# Patient Record
Sex: Female | Born: 1965 | Race: White | Hispanic: No | Marital: Married | Smoking: Never smoker
Health system: Southern US, Community
[De-identification: ages and names within clinical notes are randomized; demographics above are authoritative.]

## PROBLEM LIST (undated history)

## (undated) DIAGNOSIS — M549 Dorsalgia, unspecified: Secondary | ICD-10-CM

## (undated) DIAGNOSIS — IMO0002 Reserved for concepts with insufficient information to code with codable children: Secondary | ICD-10-CM

## (undated) DIAGNOSIS — E7800 Pure hypercholesterolemia, unspecified: Secondary | ICD-10-CM

## (undated) DIAGNOSIS — E559 Vitamin D deficiency, unspecified: Secondary | ICD-10-CM

## (undated) DIAGNOSIS — E039 Hypothyroidism, unspecified: Secondary | ICD-10-CM

## (undated) DIAGNOSIS — K829 Disease of gallbladder, unspecified: Secondary | ICD-10-CM

## (undated) HISTORY — DX: Pure hypercholesterolemia, unspecified: E78.00

## (undated) HISTORY — DX: Reserved for concepts with insufficient information to code with codable children: IMO0002

## (undated) HISTORY — DX: Vitamin D deficiency, unspecified: E55.9

## (undated) HISTORY — DX: Hypothyroidism, unspecified: E03.9

## (undated) HISTORY — PX: TONSILLECTOMY: SUR1361

## (undated) HISTORY — DX: Dorsalgia, unspecified: M54.9

## (undated) HISTORY — DX: Disease of gallbladder, unspecified: K82.9

---

## 1998-11-29 ENCOUNTER — Other Ambulatory Visit: Admission: RE | Admit: 1998-11-29 | Discharge: 1998-11-29 | Payer: Self-pay | Admitting: Gynecology

## 2001-01-25 ENCOUNTER — Other Ambulatory Visit: Admission: RE | Admit: 2001-01-25 | Discharge: 2001-01-25 | Payer: Self-pay | Admitting: Gynecology

## 2001-09-25 ENCOUNTER — Encounter: Admission: RE | Admit: 2001-09-25 | Discharge: 2001-12-24 | Payer: Self-pay | Admitting: Gynecology

## 2002-06-11 ENCOUNTER — Other Ambulatory Visit: Admission: RE | Admit: 2002-06-11 | Discharge: 2002-06-11 | Payer: Self-pay | Admitting: Gynecology

## 2003-02-18 ENCOUNTER — Ambulatory Visit (HOSPITAL_COMMUNITY): Admission: RE | Admit: 2003-02-18 | Discharge: 2003-02-18 | Payer: Self-pay | Admitting: Gynecology

## 2003-02-18 ENCOUNTER — Encounter (INDEPENDENT_AMBULATORY_CARE_PROVIDER_SITE_OTHER): Payer: Self-pay | Admitting: *Deleted

## 2003-02-26 HISTORY — PX: VAGINAL HYSTERECTOMY: SUR661

## 2003-03-10 ENCOUNTER — Observation Stay (HOSPITAL_COMMUNITY): Admission: RE | Admit: 2003-03-10 | Discharge: 2003-03-11 | Payer: Self-pay | Admitting: Gynecology

## 2003-03-11 ENCOUNTER — Encounter (INDEPENDENT_AMBULATORY_CARE_PROVIDER_SITE_OTHER): Payer: Self-pay | Admitting: Specialist

## 2004-05-11 ENCOUNTER — Other Ambulatory Visit: Admission: RE | Admit: 2004-05-11 | Discharge: 2004-05-11 | Payer: Self-pay | Admitting: Gynecology

## 2004-05-23 ENCOUNTER — Encounter: Admission: RE | Admit: 2004-05-23 | Discharge: 2004-05-23 | Payer: Self-pay | Admitting: Gynecology

## 2005-05-16 ENCOUNTER — Other Ambulatory Visit: Admission: RE | Admit: 2005-05-16 | Discharge: 2005-05-16 | Payer: Self-pay | Admitting: Gynecology

## 2005-06-07 ENCOUNTER — Encounter: Admission: RE | Admit: 2005-06-07 | Discharge: 2005-06-07 | Payer: Self-pay | Admitting: Gynecology

## 2006-07-09 ENCOUNTER — Other Ambulatory Visit: Admission: RE | Admit: 2006-07-09 | Discharge: 2006-07-09 | Payer: Self-pay | Admitting: Gynecology

## 2006-09-03 ENCOUNTER — Encounter: Admission: RE | Admit: 2006-09-03 | Discharge: 2006-09-03 | Payer: Self-pay | Admitting: Obstetrics and Gynecology

## 2007-07-12 ENCOUNTER — Other Ambulatory Visit: Admission: RE | Admit: 2007-07-12 | Discharge: 2007-07-12 | Payer: Self-pay | Admitting: Gynecology

## 2007-09-06 ENCOUNTER — Encounter: Admission: RE | Admit: 2007-09-06 | Discharge: 2007-09-06 | Payer: Self-pay | Admitting: Obstetrics and Gynecology

## 2008-07-15 ENCOUNTER — Encounter: Payer: Self-pay | Admitting: Women's Health

## 2008-07-15 ENCOUNTER — Other Ambulatory Visit: Admission: RE | Admit: 2008-07-15 | Discharge: 2008-07-15 | Payer: Self-pay | Admitting: Family Medicine

## 2008-07-15 ENCOUNTER — Ambulatory Visit: Payer: Self-pay | Admitting: Women's Health

## 2008-09-08 ENCOUNTER — Encounter: Admission: RE | Admit: 2008-09-08 | Discharge: 2008-09-08 | Payer: Self-pay | Admitting: Obstetrics and Gynecology

## 2009-03-10 ENCOUNTER — Ambulatory Visit: Payer: Self-pay | Admitting: Women's Health

## 2009-07-16 ENCOUNTER — Other Ambulatory Visit: Admission: RE | Admit: 2009-07-16 | Discharge: 2009-07-16 | Payer: Self-pay | Admitting: Gynecology

## 2009-07-16 ENCOUNTER — Ambulatory Visit: Payer: Self-pay | Admitting: Women's Health

## 2009-12-03 ENCOUNTER — Ambulatory Visit: Payer: Self-pay | Admitting: Women's Health

## 2009-12-10 ENCOUNTER — Encounter: Admission: RE | Admit: 2009-12-10 | Discharge: 2009-12-10 | Payer: Self-pay | Admitting: Gynecology

## 2010-05-28 DIAGNOSIS — IMO0002 Reserved for concepts with insufficient information to code with codable children: Secondary | ICD-10-CM

## 2010-05-28 HISTORY — DX: Reserved for concepts with insufficient information to code with codable children: IMO0002

## 2010-08-01 ENCOUNTER — Other Ambulatory Visit
Admission: RE | Admit: 2010-08-01 | Discharge: 2010-08-01 | Payer: Self-pay | Source: Home / Self Care | Admitting: Gynecology

## 2010-08-01 ENCOUNTER — Ambulatory Visit: Payer: Self-pay | Admitting: Women's Health

## 2011-01-13 NOTE — Op Note (Signed)
NAME:  Megan Potts, Megan Potts                        ACCOUNT NO.:  [id number]   MEDICAL RECORD NO.:  [id number]                   PATIENT TYPE:  AMB   LOCATION:  SDC                                  FACILITY:  [facility]   PHYSICIAN:  Ivor Costa. Farrel Gobble, M.D.              DATE OF BIRTH:  Sep 22, 1965   DATE OF PROCEDURE:  03/10/2003  DATE OF DISCHARGE:                                 OPERATIVE REPORT   PREOPERATIVE DIAGNOSIS:  Complex hyperplasia with atypia, cannot rule out  well-differentiated adenocarcinoma of the endometrium.   POSTOPERATIVE DIAGNOSIS:  Complex hyperplasia with atypia, cannot rule out  well-differentiated adenocarcinoma of the endometrium.   PROCEDURE:  Total vaginal hysterectomy.   SURGEON:  Ivor Costa. Farrel Gobble, M.D.   ASSISTANTMarcial Pacas P. Fontaine, M.D.   ANESTHESIA:  General.   FLUIDS REPLACED:  1800 mL lactated Ringer's.   URINE OUTPUT:  Not available.   ESTIMATED BLOOD LOSS:  Less than 100.   FINDINGS:  Unremarkable uterine serosa and cervix.   PATHOLOGY:  Uterus and cervix sent fresh to pathology.   PROCEDURE:  The patient was taken to the operating room, general anesthesia  was induced, and placed in the dorsal lithotomy position, prepped and draped  in the usual sterile fashion.  A sterile weighted speculum was placed in the  vagina, this was a Training and development officer.  The cervix was visualized, grasped with a single-  tooth tenaculum, and the vaginal mucosa was then injected with a total of 15  mL of a dilute Pitressin solution of 10 units in 50 mL of normal saline,  after which the vaginal mucosa was scored circumferentially around the  cervix.  The vaginal mucosa was then bluntly dissected up.  The cervix was  deviated superiorly.  The vaginal mucosa in the fornix was grasped  posteriorly and a posterior colpotomy was then performed.  It was extended  and the long sterile weighted Bonnano speculum was placed in the vagina.  The vaginal mucosa was dissected further.  The  uterosacral ligaments were  then clamped, transected, and suture ligated with 0 Vicryl and held.  The  anterior mucosa was then further dissected off sharply.  The peritoneum of  the bladder reflection was identified, tented up, and entered sharply.  The  placement in the anterior cul-de-sac was confirmed with evidence of bowel  fat.  Once this was confirmed, the cardinal ligaments, uterine vessels were  transected and suture ligated, respectively, with 0 Vicryl.  Dissection  carried through up into the round ligament bilaterally.  The uterus was then  inverted.  The tubo-ovarian ligament was crossclamped, transected, and  suture ligated.  This was done bilaterally.  The tubo-ovarian ligament was  doubly tied with a free tie and a double suture ligature, noted to be  hemostatic.  The ovaries were inspected and noted to be unremarkable.  The  pelvis was irrigated.  The Bonnano speculum was replaced  with a short  weighted.  The posterior peritoneum was plicated to the vaginal mucosa  starting at the uterosacral ligament and carried through to just superior to  the opposite uterosacral ligament.  The anterior peritoneum was then  grasped.  The vaginal mucosa was then plicated in the midline, closing the  cuff in its entirety.  The mucosa was noted to be hemostatic.  The  instruments were removed, the Foley was placed, clear urine was obtained.  The patient was then extubated in the OR and transferred to the PACU in  stable condition.                                               Ivor Costa. Farrel Gobble, M.D.    THL/MEDQ  D:  03/10/2003  T:  03/10/2003  Job:  [id number]

## 2011-01-13 NOTE — H&P (Signed)
NAME:  Megan Potts, Megan Potts                        ACCOUNT NO.:  [id number]   MEDICAL RECORD NO.:  [id number]                   PATIENT TYPE:  AMB   LOCATION:  SDC                                  [facility]ILITY:  [facility]   PHYSICIAN:  Ivor Costa. Farrel Gobble, M.D.              DATE OF BIRTH:  01/13/1966   DATE OF ADMISSION:  03/10/2003  DATE OF DISCHARGE:                                HISTORY & PHYSICAL   CHIEF COMPLAINT:  Complex hyperplasia with atypia, cannot rule out well  differentiated endometrial carcinoma.   HISTORY OF PRESENT ILLNESS:  The patient is a 45 year old G2 P2  contracepting with a vasectomy who had been having increasing dysmenorrhea  as well as some dyspareunia as a result of which she underwent a laparoscopy  on February 18, 2003 for which turned out to be endometriosis and was treated  appropriately.  She also had a D&C hysteroscopy.  The D&C hysteroscopy was  unremarkable; however, the curettage from the endometrium showed  hyperplastic polyp with complex hyperplasia, cannot rule out endometrial  carcinoma, as a result of which the patient presents today for definitive  surgery.  She is without any complaints.   PAST OBSTETRICAL AND GYNECOLOGICAL HISTORY:  She has had two vaginal  deliveries.  She has had normal Pap smears.  She had a single episode of  post coital bleeding in October 2003 but none since.  Again, contracepting  with a vasectomy.   PAST MEDICAL HISTORY:  Negative.   SURGICAL HISTORY:  Laparoscopy hysteroscopy as above.   ALLERGIES:  PENICILLIN.   MEDICATIONS:  None.   SOCIAL HISTORY:  She is married.  No alcohol or tobacco, rare caffeine, and  some exercise.   FAMILY HISTORY:  Negative for breast, ovarian, uterine or colon cancer.  Otherwise is noncontributory.   REVIEW OF SYSTEMS:  The patient has had no constitutional symptoms, no  unexplained weight loss, no abdominal bloating, no fevers or chills, etc.   PHYSICAL EXAMINATION:  GENERAL:  She is  well-appearing in no acute distress.  HEART:  Regular rate.  LUNGS:  Clear to auscultation.  BREASTS:  Without mass, discharge, or retractions.  ABDOMEN:  Soft, nontender.  PELVIC:  She has normal external female genitalia.  The BUS is negative.  The vagina is pink and moist.  The cervix is without lesions and well  supported.  The uterus is globular and mobile.  The rectovaginal exam was  deferred.  Ultrasound shows the uterus to be 8.7 x 5.1 x 6.6 cm without any  focal fibroids.    ASSESSMENT:  Complex hyperplasia with atypia.  The patient will present in  the afternoon of March 10, 2003 for definitive surgery.  We are planning a  vaginal hysterectomy.  However, the patient is aware that this may be  converted to an abdominal hysterectomy.  All questions were addressed.  Ivor Costa. Farrel Gobble, M.D.    THL/MEDQ  D:  03/09/2003  T:  03/09/2003  Job:  [id number]

## 2011-01-13 NOTE — H&P (Signed)
NAME:  Megan Potts, Megan Potts                        ACCOUNT NO.:  [id number]   MEDICAL RECORD NO.:  [id number]                   PATIENT TYPE:  AMB   LOCATION:  [facility]C                                  [facility]   PHYSICIAN:  Ivor Costa. Farrel Gobble, M.D.              DATE OF BIRTH:  04/26/1966   DATE OF ADMISSION:  02/18/2003  DATE OF DISCHARGE:                                HISTORY & PHYSICAL   CHIEF COMPLAINT:  Dysmenorrhea, dyspareunia, and menorrhagia.   HISTORY OF PRESENT ILLNESS:  The patient is a 45 year old, gravida 2, para  2, contracepting with a vasectomy, who has been reporting increasing  incidence of dysmenorrhea. She has also been noting dyspareunia over the  past several cycles. She did have a single episode of breakthrough bleeding  that did not recur. Her cycles have gotten so severe that she has actually  required some Darvocet in order to counter the severe cramping. She had an  ultrasound done in October when she first complained of postcoital bleeding  that showed her uterus to be globular and enlarged without any distinct  masses. She presented back to the office at the end of May for a  sonohysterogram when her dysmenorrhea continued to increase as well as  having some menorrhagia. The sonohysterogram showed some questionable small  wall defects, the largest of which measured 10 x 6 mm.   PAST OBSTETRICAL HISTORY:  Significant for two vaginal deliveries. She has  had regular Pap smears which have been normal.  They are contracepting with  a vasectomy.  She has been having dyspareunia for approximately eight  months.   PAST MEDICAL HISTORY:  Negative.   PAST SURGICAL HISTORY:  Negative.   ALLERGIES:  PENICILLIN.   MEDICATIONS:  None.   SOCIAL HISTORY:  She is married. No alcohol, no tobacco. Rare caffeine and  some exercise.   FAMILY HISTORY:  Negative for breast, ovarian, uterine, or colon cancer.  Otherwise noncontributory.   PHYSICAL EXAMINATION:   GENERAL: She is well-appearing in no acute distress.  HEART:  Regular rate and rhythm.  LUNGS:  Clear to auscultation.  BREASTS: Without mass, discharge, or retractions.  ABDOMEN: Soft and nontender.  PELVIC: She has normal external genitalia. BUS is negative. The vagina was  pink and moist. Cervix is without lesions. Anteverted, slightly enlarged  without distinct fibroids. The adnexa were negative.  Rectovaginal  examination was confirmatory.   ASSESSMENT:  Increasing dyspareunia with dysmenorrhea. The patient with  questionable endometrial polyps. The patient will present for a diagnostic  laparoscopy to evaluate for possible endometriosis or scar tissue and  treatment of such. At the same point we will go ahead and do a diagnostic  hysteroscopy if indeed these are endometrial polyps and not artifact, we  will go ahead and resect those as well. All questions were addressed. Risks  and benefits of the  procedure were discussed including risks of bleeding,  infection, uterine  perforation, absorption of the distending media, damage to underlying bowel,  major vessels, or bladder during trocar placement on a laparoscopy, bowel  injury during laparoscopy, and postoperative DVT. She will present on the  afternoon of June 23, for surgery.                                               Ivor Costa. Farrel Gobble, M.D.    THL/MEDQ  D:  02/17/2003  T:  02/17/2003  Job:  [id number]

## 2011-01-13 NOTE — Op Note (Signed)
NAME:  Megan Potts, Megan Potts                        ACCOUNT NO.:  [id number]   MEDICAL RECORD NO.:  [id number]                   PATIENT TYPE:  AMB   LOCATION:  [facility]C                                  [facility]   PHYSICIAN:  Ivor Costa. Farrel Gobble, M.D.              DATE OF BIRTH:  [birth year]   DATE OF PROCEDURE:  02/17/2003  DATE OF DISCHARGE:                                 OPERATIVE REPORT   PREOPERATIVE DIAGNOSES:  1. Dysmenorrhea.  2. Dyspareunia.  3. Menorrhagia.   POSTOPERATIVE DIAGNOSES:  1. Dysmenorrhea.  2. Dyspareunia.  3. Menorrhagia.  4. Endometriosis.  5. Questionably endometrial polyps.   PROCEDURES:  1. Laparoscopic fulguration of endometriosis.  2. Removal of paratubal cyst.  3. Dilatation and curettage hysteroscopy.   SURGEON:  Ivor Costa. Farrel Gobble, M.D.   ANESTHESIA:  General.   IV FLUIDS:  1600 mL of lactated Ringers.   ESTIMATED BLOOD LOSS:  Approximately 100 mL for the two procedures.   INTAKE AND OUTPUT DEFICIT:  100 mL of 3% sorbitol solution.   FINDINGS:  There were multiple white, red, and black endometriotic plaques  scattered throughout the posterior pelvis also as well as the tubes.  There  were small amounts on the bowel and epiploica.  The uterus sounded 9 cm, it  was antevert, questionable endometrial polyps.  Otherwise, no adverse  defect.   COMPLICATIONS:  None.   PATHOLOGY:  Endometrial curettings and paratubal cyst.   PROCEDURE:  The patient was taken to the operating room.  General anesthesia  was induced, placed in the dorsal lithotomy position, prepped and draped in  the usual sterile fashion.  The bivalve was placed in the vagina.  The  cervix was visualized.  A single-tooth tenaculum and a Hulka manipulator was  placed.  Attention was then turned to the abdomen.  Gloves were changed and  an umbilical incision was made with a scalpel.  A Veress needle was  inserted.  Opening pressure was four.  Pneumoperitoneum was created until  tympany was appreciated above the liver.  The Veress needle was removed.  The 10-11 disposable trocar was inserted through the infraumbilical port.  Placement in the abdominal cavity was confirmed.  The patient was then  placed in steep Trendelenburg position.  The uterus was elevated and using  the operative scope, we began to look underneath the adnexa on the left.  Multiple thick endometriotic plaques were noted, fortunately, free of the  ureters.  There was peristalsis throughout the case.  Similarly, under the  right ovary there were a few scattered white and red endometriotic plaques.  In the posterior cul-de-sac on the right there was a thickened black plaque  and multiple small plaques between the uterosacral ligament.  There was also  thick white plaques on the left uterus.  A second 5-mm port was placed in  order to help elevate the tubes and ovaries.  There was  also noted to be a  paratubal cyst on the left tube.  This was sharply dissected off and placed  in the posterior cul-de-sac to be removed later.  The tuboovarian ligament  was then grasped and gently elevated.  The area under the ovary was then  treated with the Kleppingers with careful visualization of the ureter at all  times.  __________  removed from the ureter.  The left angle was also  similarly treated with the Kleppinger.   Attention was then turned to the right.  The right ovary was elevated with  the aid of the atraumatic grasper and the lesions under there were also  similarly treated.  Again, peristalsis of the ureter was noted during the  procedure.  The patient was then levorotated so that we could better access  the large black plaque that was in the cul-de-sac.  This was similarly  treated as were areas between the uterosacrals which were spot treated.  Attention was then turned back to the left side.  The tuboovarian ligament  was then grasped in order to identify the plaque that was seen on the tubes;   however, incidentally, with the tuboovarian ligament.  This was then treated  with the Kleppinger.  Once hemostasis was assured the pelvis was irrigated  to reassure [place] of hemostasis in this area.  Then the tube was gently  elevated and the area on the tube was treated with Kleppingers as well.  The  pelvis was then irrigated with copious amounts of saline.  Reinspection of  the area that had bleeding was now assured hemostatic.  The pneumoperitoneum  was released.  The trocars were left in place.  Attention was then turned to  the pelvis.  The uterine manipulator was removed.  The sterile weighted  speculum was placed in the vagina.  The cervix was stabilized with a single-  tooth tenaculum sounding to 9.  The cervix was then gently dilated up to a  23-French, after which a diagnostic hysteroscope was advanced through the  cervix.  There was noted to be a moderate amount of debris in the cavity.  The patient had started bleeding the day prior to the procedure.  Again  using the polyp forceps, however, we could not remove any debris.  Gentle  curetting, however, removed a moderate amount of tissue.  The hysteroscope  was then replaced.  The cavity was cleared of blood.  What looked to be a  polyp on the posterior lateral wall on the right was visualized.  The  hysteroscope was removed.  This area was again treated with a sharp  curetting.  Once the hysteroscope was replaced we assured ourselves that  this area had now been removed as well.  The hysteroscope was then removed.  The instruments were removed through the vagina.  The cervix was noted to be  hemostatic.  Gloves were changed.  The pneumoperitoneum was recreated.  Any  excess fluid that was seen in the pelvis was gently suctioned out.  The  pneumoperitoneum was then again re-released under direct visualization prior  to which we did look for any further bleeding in the cul-de-sac from the left adnexal of which there was none.  While  the pneumoperitoneum was being  released under visualization, epiploica came into view.  It had an  endometriotic plaque on it, albeit small.  This area was not treated. The  pneumoperitoneum was then completely removed.  The fascia was closed with a  figure-of-eight of  0 Vicryl.  The skin was closed with 3-0 plain.  Both  ports were injected for a total of 10 mL of 0.25% Marcaine solution.  The  patient tolerated the procedure well.  Sponge, instrument, and needle counts  were correct x2.  She was transferred to the PACU in stable condition.                                               Ivor Costa. Farrel Gobble, M.D.    THL/MEDQ  D:  02/18/2003  T:  02/19/2003  Job:  [id number]

## 2011-08-04 ENCOUNTER — Encounter: Payer: Self-pay | Admitting: Women's Health

## 2011-09-01 ENCOUNTER — Encounter: Payer: Self-pay | Admitting: Women's Health

## 2011-09-01 ENCOUNTER — Ambulatory Visit (INDEPENDENT_AMBULATORY_CARE_PROVIDER_SITE_OTHER): Payer: Self-pay | Admitting: Women's Health

## 2011-09-01 VITALS — BP 114/76 | Ht 68.75 in | Wt 184.0 lb

## 2011-09-01 DIAGNOSIS — Z01419 Encounter for gynecological examination (general) (routine) without abnormal findings: Secondary | ICD-10-CM

## 2011-09-01 NOTE — Progress Notes (Signed)
Kennley Schwandt Henline Mar 13, 1966 [id number]    History:    The patient presents for annual exam. No complaints. TVH in 2004 DUB with an atypical polyp. History of all normal Paps. Same partner. Normal mammograms- last  2011. States had right lower quadrant pain for several days that resolved on its own, history of ovarian cysts.   Past medical history, past surgical history, family history and social history were all reviewed and documented in the EPIC chart.   ROS:  A  ROS was performed and pertinent positives and negatives are included in the history.  Exam:  Filed Vitals:   09/01/11 0924  BP: 114/76    General appearance:  Normal Head/Neck:  Normal, without cervical or supraclavicular adenopathy. Thyroid:  Symmetrical, normal in size, without palpable masses or nodularity. Respiratory  Effort:  Normal  Auscultation:  Clear without wheezing or rhonchi Cardiovascular  Auscultation:  Regular rate, without rubs, murmurs or gallops  Edema/varicosities:  Not grossly evident Abdominal  Soft,nontender, without masses, guarding or rebound.  Liver/spleen:  No organomegaly noted  Hernia:  None appreciated  Skin  Inspection:  Grossly normal  Palpation:  Grossly normal Neurologic/psychiatric  Orientation:  Normal with appropriate conversation.  Mood/affect:  Normal  Genitourinary    Breasts: Examined lying and sitting.     Right: Without masses, retractions, discharge or axillary adenopathy.     Left: Without masses, retractions, discharge or axillary adenopathy.   Inguinal/mons:  Normal without inguinal adenopathy  External genitalia:  Normal  BUS/Urethra/Skene's glands:  Normal  Bladder:  Normal  Vagina: +1 cystocele  Cervix:  Absent  Uterus:  Absent  Adnexa/parametria:     Rt: Without masses or tenderness.   Lt: Without masses or tenderness.  Anus and perineum: Normal  Digital rectal exam: Normal sphincter tone without palpated masses or tenderness  Assessment/Plan:  46  y.o. DW F. G2 P2  for annual exam without complaints.   TVH 04/+1 cystocele/asymptomatic  Plan: Overdue for mammogram, without insurance, information about scholarship mammogram program at [facility] was given will schedule. Continue SBEs, exercise, calcium rich diet, and vitamin supplements. History of slightly elevated triglycerides, reviewed importance of fish oil supplement, exercise, low saturated fat diet. No labs today will do labs at next annual exam. Instructed to return to the office if has low abdominal pain that does not resolve on its own.    Harrington Challenger WHNP, 10:05 AM 09/01/2011

## 2011-11-16 ENCOUNTER — Telehealth: Payer: Self-pay | Admitting: *Deleted

## 2011-11-16 NOTE — Telephone Encounter (Signed)
Telephone call to review problems. States is having seasonal type allergies with nasal congestion. Will try over-the-counter Zyrtec. Denies fever.

## 2011-11-16 NOTE — Telephone Encounter (Signed)
Pt called c/o sinus issues and would like rx for her sinuses. Pt is taking Flonase nasal spray, please advise

## 2011-12-11 ENCOUNTER — Telehealth: Payer: Self-pay | Admitting: *Deleted

## 2011-12-11 DIAGNOSIS — R102 Pelvic and perineal pain: Secondary | ICD-10-CM

## 2011-12-11 NOTE — Telephone Encounter (Signed)
Pt informed with the below note, order placed in computer. 

## 2011-12-11 NOTE — Telephone Encounter (Signed)
Pt calling c/o lower back pain, breast tenderness, weight gain, cramping in her ovaries. Pt has TVH in 2004, saw nancy for her annual in jan 2013 and was told to make OV if lower abdominal pain continues. She has no insurance, I told pt that i will send symptoms to you. Please advise

## 2011-12-11 NOTE — Telephone Encounter (Signed)
She needs an office visit and an ultrasound. I cannot treat without examining patient.

## 2011-12-25 ENCOUNTER — Other Ambulatory Visit: Payer: Self-pay

## 2011-12-25 ENCOUNTER — Ambulatory Visit: Payer: Self-pay | Admitting: Gynecology

## 2012-01-01 ENCOUNTER — Ambulatory Visit (INDEPENDENT_AMBULATORY_CARE_PROVIDER_SITE_OTHER): Payer: Self-pay | Admitting: Gynecology

## 2012-01-01 ENCOUNTER — Encounter: Payer: Self-pay | Admitting: Gynecology

## 2012-01-01 ENCOUNTER — Ambulatory Visit (INDEPENDENT_AMBULATORY_CARE_PROVIDER_SITE_OTHER): Payer: Self-pay

## 2012-01-01 VITALS — BP 120/78

## 2012-01-01 DIAGNOSIS — R102 Pelvic and perineal pain: Secondary | ICD-10-CM

## 2012-01-01 DIAGNOSIS — N949 Unspecified condition associated with female genital organs and menstrual cycle: Secondary | ICD-10-CM

## 2012-01-01 DIAGNOSIS — N3281 Overactive bladder: Secondary | ICD-10-CM | POA: Insufficient documentation

## 2012-01-01 DIAGNOSIS — IMO0002 Reserved for concepts with insufficient information to code with codable children: Secondary | ICD-10-CM

## 2012-01-01 DIAGNOSIS — N831 Corpus luteum cyst of ovary, unspecified side: Secondary | ICD-10-CM

## 2012-01-01 DIAGNOSIS — N318 Other neuromuscular dysfunction of bladder: Secondary | ICD-10-CM

## 2012-01-01 MED ORDER — OXYBUTYNIN CHLORIDE 5 MG PO TABS: ORAL_TABLET | ORAL | Status: DC

## 2012-01-01 NOTE — Progress Notes (Signed)
Patient is a 46 year old gravida 2 para 2 who presented to the office today with several months history of suprapubic discomfort and on several occasions may have had some dyspareunia but otherwise is not an issue anymore. Patient with prior history of transvaginal hysterectomy in 2004 for atypical complex hyperplasia and leiomyomatous uteri. Review pathology report demonstrated that "qualifying features of carcinoma were not identified". Patient states that she does suffer from urinary frequency and nocturia whereby she gets up in the middle of the night at least 2-3 times. She does drink 2-3 cups of tea daily and drinks lots of fluid as well. She does state that when she doesn't drink that much fluid she still has the frequency and nocturia. She'll call the office for the symptoms in the nurse practitioner asked her to have an ultrasound today and to see me for further discussion. Patient denies any problems with bowel movements.  Exam: Back: No CVA tenderness Abdomen: Soft nontender no rebound or guarding Pelvic: Bartholin urethra Skene was within normal limits Vagina: No gross lesions on inspection Cervix: Absent  Bimanual exam: unremarkable Rectal exam: Unremarkable  Ultrasound vaginal cuff with a small calcified area measuring 5 x 6 mm. Right ovary thickwalled corpus luteum cyst measuring 18 x 15 mm with positive color flow the periphery. Left ovary thick wall collapse follicle 17 x 15 mm positive color flow periphery. Negative fluid in the cul-de-sac.  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 She is going to be started on Ditropan 5 mg to take once daily in the morning for the first week and if need she can take 1 twice a day. She'll return back in 6 months for followup as well as an ultrasound once again to assess her ovaries. The small incidental calcification noted at the vaginal cuff which probably has no  significant to her symptomatology will be reviewed on the followup ultrasound. (Was not present on ultrasound in 2009). She was instructed to cut down her fluid intake as well as on her caffeine-containing products as well. We'll continue to monitor symptoms. Literature information was provided. The risks benefits and pros and cons of the medication were discussed. All questions rancher will follow accordingly.

## 2012-01-01 NOTE — Patient Instructions (Signed)
Urinary Frequency The number of times a normal person urinates depends upon how much liquid they take in and how much liquid they are losing. If the temperature is hot and there is high humidity then the person will sweat more and usually breathe a little more frequently. These factors decrease the amount of frequency of urination that would be considered normal. The amount you drink is easily determined, but the amount of fluid lost is sometimes more difficult to calculate.  Fluid is lost in two ways:  Sensible fluid loss is usually measured by the amount of urine that you get rid of. Losses of fluid can also occur with diarrhea.   Insensible fluid loss is more difficult to measure. It is caused by evaporation. Insensible loss of fluid occurs through breathing and sweating. It usually ranges from a little less than a quart to a little more than a quart of fluid a day.  In normal temperatures and activity levels the average person may urinate 4 to 7 times in a 24-hour period. Needing to urinate more often than that could indicate a problem. If one urinates 4 to 7 times in 24 hours and has large volumes each time, that could indicate a different problem from one who urinates 4 to 7 times a day and has small volumes. The time of urinating is also an important. Most urinating should be done during the waking hours. Getting up at night to urinate frequently can indicate some problems. CAUSES  The bladder is the organ in your lower abdomen that holds urine. Like a balloon, it swells some as it fills up. Your nerves sense this and tell you it is time to head for the bathroom. There are a number of reasons that you might feel the need to urinate more often than usual. They include:  Urinary tract infection. This is usually associated with other signs such as burning when you urinate.   In men, problems with the prostate (a walnut-size gland that is located near the tube that carries urine out of your body).  There are two reasons why the prostate can cause an increased frequency of urination:   An enlarged prostate that does not let the bladder empty well. If the bladder only half empties when you urinate then it only has half the capacity to fill before you have to urinate again.   The nerves in the bladder become more hypersensitive with an increased size of the prostate even if the bladder empties completely.   Pregnancy.   Obesity. Excess weight is more likely to cause a problem for women more than for men.   Bladder stones or other bladder problems.   Caffeine.   Alcohol.   Medications. For example, drugs that help the body get rid of extra fluid (diuretics) increase urine production. Some other medicines must be taken with lots of fluids.   Muscle or nerve weakness. This might be the result of a spinal cord injury, a stroke, multiple sclerosis or Parkinson's disease.   Long-standing diabetes can decrease the sensation of the bladder. This loss of sensation makes it harder to sense the bladder needs to be emptied. Over a period of years the bladder is stretched out by constant overfilling. This weakens the bladder muscles so that the bladder does not empty well and has less capacity to fill with new urine.   Interstitial cystitis (also called painful bladder syndrome). This condition develops because the tissues that line the insider of the bladder are inflamed (  inflammation is the body's way of reacting to injury or infection). It causes pain and frequent urination. It occurs in women more often than in men.  DIAGNOSIS   To decide what might be causing your urinary frequency, your healthcare provider will probably:   Ask about symptoms you have noticed.   Ask about your overall health. This will include questions about any medications you are taking.   Do a physical examination.   Order some tests. These might include:   A blood test to check for diabetes or other health issues  that could be contributing to the problem.   Urine testing. This could measure the flow of urine and the pressure on the bladder.   A test of your neurological system (the brain, spinal cord and nerves). This is the system that senses the need to urinate.   A bladder test to check whether it is emptying completely when you urinate.   Cytoscopy. This test uses a thin tube with a tiny camera on it. It offers a look inside your urethra and bladder to see if there are problems.   Imaging tests. You might be given a contrast dye and then asked to urinate. X-rays are taken to see how your bladder is working.  TREATMENT  It is important for you to be evaluated to determine if the amount or frequency that you have is unusual or abnormal. If it is found to be abnormal the cause should be determined and this can usually be found out easily. Depending upon the cause treatment could include medication, stimulation of the nerves, or surgery. There are not too many things that you can do as an individual to change your urinary frequency. It is important that you balance the amount of fluid intake needed to compensate for your activity and the temperature. Medical problems will be diagnosed and taken care of by your physician. There is no particular bladder training such as Kegel's exercises that you can do to help urinary frequency. This is an exercise this is usually done for people who have leaking of urine when they laugh cough or sneeze. HOME CARE INSTRUCTIONS   Take any medications your healthcare provider prescribed or suggested. Follow the directions carefully.   Practice any lifestyle changes that are recommended. These might include:   Drinking less fluid or drinking at different times of the day. If you need to urinate often during the night, for example, you may need to stop drinking fluids early in the evening.   Cutting down on caffeine or alcohol. They both can make you need to urinate more  often than normal. Caffeine is found in coffee, tea and sodas.   Losing weight, if that is recommended.   Keep a journal or a log. You might be asked to record how much you drink and when and when you feel the need to urinate. This will also help evaluate how well the treatment provided by your physician is working.  SEEK MEDICAL CARE IF:   Your need to urinate often gets worse.   You feel increased pain or irritation when you urinate.   You notice blood in your urine.   You have questions about any medications that your healthcare provider recommended.   You notice blood, pus or swelling at the site of any test or treatment procedure.   You develop a fever of more than 100.5 F (38.1 C).  SEEK IMMEDIATE MEDICAL CARE IF:  You develop a fever of more than 102.0   F (38.9 C). Document Released: 06/10/2009 Document Revised: 08/03/2011 Document Reviewed: 06/10/2009 [employer] Patient Information 2012 [employer]. 

## 2012-03-28 HISTORY — PX: OTHER SURGICAL HISTORY: SHX169

## 2012-04-22 ENCOUNTER — Other Ambulatory Visit: Payer: Self-pay | Admitting: Gynecology

## 2012-04-22 DIAGNOSIS — Z1231 Encounter for screening mammogram for malignant neoplasm of breast: Secondary | ICD-10-CM

## 2012-05-15 ENCOUNTER — Ambulatory Visit
Admission: RE | Admit: 2012-05-15 | Discharge: 2012-05-15 | Disposition: A | Discharge status: Home / Self Care | Payer: BC Managed Care – PPO | Source: Ambulatory Visit | Attending: Gynecology | Admitting: Gynecology

## 2012-05-15 DIAGNOSIS — Z1231 Encounter for screening mammogram for malignant neoplasm of breast: Secondary | ICD-10-CM

## 2012-09-06 ENCOUNTER — Encounter: Payer: BC Managed Care – PPO | Admitting: Women's Health

## 2012-09-12 ENCOUNTER — Ambulatory Visit (INDEPENDENT_AMBULATORY_CARE_PROVIDER_SITE_OTHER): Payer: BC Managed Care – PPO | Admitting: Women's Health

## 2012-09-12 ENCOUNTER — Encounter: Payer: Self-pay | Admitting: Women's Health

## 2012-09-12 VITALS — BP 122/68 | Ht 68.75 in | Wt 184.0 lb

## 2012-09-12 DIAGNOSIS — Z9049 Acquired absence of other specified parts of digestive tract: Secondary | ICD-10-CM | POA: Insufficient documentation

## 2012-09-12 DIAGNOSIS — Z833 Family history of diabetes mellitus: Secondary | ICD-10-CM

## 2012-09-12 DIAGNOSIS — Z13220 Encounter for screening for lipoid disorders: Secondary | ICD-10-CM

## 2012-09-12 DIAGNOSIS — Z01419 Encounter for gynecological examination (general) (routine) without abnormal findings: Secondary | ICD-10-CM

## 2012-09-12 DIAGNOSIS — Z9089 Acquired absence of other organs: Secondary | ICD-10-CM

## 2012-09-12 LAB — CBC WITH DIFFERENTIAL/PLATELET
Basophils Absolute: 0 10*3/uL (ref 0.0–0.1)
Basophils Relative: 1 % (ref 0–1)
Eosinophils Absolute: 0.1 10*3/uL (ref 0.0–0.7)
Eosinophils Relative: 2 % (ref 0–5)
HCT: 37.3 % (ref 36.0–46.0)
Hemoglobin: 12.4 g/dL (ref 12.0–15.0)
Lymphocytes Relative: 28 % (ref 12–46)
Lymphs Abs: 1.8 10*3/uL (ref 0.7–4.0)
MCH: 29.5 pg (ref 26.0–34.0)
MCHC: 33.2 g/dL (ref 30.0–36.0)
MCV: 88.8 fL (ref 78.0–100.0)
Monocytes Absolute: 0.4 10*3/uL (ref 0.1–1.0)
Monocytes Relative: 7 % (ref 3–12)
Neutro Abs: 3.9 10*3/uL (ref 1.7–7.7)
Neutrophils Relative %: 62 % (ref 43–77)
Platelets: 350 10*3/uL (ref 150–400)
RBC: 4.2 MIL/uL (ref 3.87–5.11)
RDW: 13.4 % (ref 11.5–15.5)
WBC: 6.2 10*3/uL (ref 4.0–10.5)

## 2012-09-12 LAB — GLUCOSE, RANDOM: Glucose, Bld: 69 mg/dL — ABNORMAL LOW (ref 70–99)

## 2012-09-12 LAB — LIPID PANEL
Cholesterol: 246 mg/dL — ABNORMAL HIGH (ref 0–200)
HDL: 65 mg/dL (ref >39)
LDL Cholesterol: 139 mg/dL — ABNORMAL HIGH (ref 0–99)
Total CHOL/HDL Ratio: 3.8 Ratio
Triglycerides: 211 mg/dL — ABNORMAL HIGH (ref <150)
VLDL: 42 mg/dL — ABNORMAL HIGH (ref 0–40)

## 2012-09-12 NOTE — Patient Instructions (Addendum)

## 2012-09-12 NOTE — Progress Notes (Signed)
Megan Potts 07-Jun-1966 [id number]    History:    The patient presents for annual exam.  TVH 2004 for atypical polyp with normal Paps following. Cholecystectomy August 2013. Normal mammograms.   Past medical history, past surgical history, family history and social history were all reviewed and documented in the EPIC chart. Self-employed accounting. Same partner. Megan Potts 25, Megan Potts 26 both doing well and working.   ROS:  A  ROS was performed and pertinent positives and negatives are included in the history.  Exam:  Filed Vitals:   09/12/12 0920  BP: 122/68    General appearance:  Normal Head/Neck:  Normal, without cervical or supraclavicular adenopathy. Thyroid:  Symmetrical, normal in size, without palpable masses or nodularity. Respiratory  Effort:  Normal  Auscultation:  Clear without wheezing or rhonchi Cardiovascular  Auscultation:  Regular rate, without rubs, murmurs or gallops  Edema/varicosities:  Not grossly evident Abdominal  Soft,nontender, without masses, guarding or rebound.  Liver/spleen:  No organomegaly noted  Hernia:  None appreciated  Skin  Inspection:  Grossly normal  Palpation:  Grossly normal Neurologic/psychiatric  Orientation:  Normal with appropriate conversation.  Mood/affect:  Normal  Genitourinary    Breasts: Examined lying and sitting.     Right: Without masses, retractions, discharge or axillary adenopathy.     Left: Without masses, retractions, discharge or axillary adenopathy.   Inguinal/mons:  Normal without inguinal adenopathy  External genitalia:  Normal  BUS/Urethra/Skene's glands:  Normal  Bladder:  Normal  Vagina:  Normal  Cervix:  Absent  Uterus:  Absent  Adnexa/parametria:     Rt: Without masses or tenderness.   Lt: Without masses or tenderness.  Anus and perineum: Normal  Digital rectal exam: Normal sphincter tone without palpated masses or tenderness  Assessment/Plan:  47 y.o. DW F. G2 P2 for annual exam with no  complaints.  TVH 04  Plan: SBE's, continue annual mammogram, calcium rich diet, increase regular exercise, vitamin D 2000 daily encouraged. Denies menopausal symptoms. CBC, glucose, lipid panel, UA, new Pap screening guidelines reviewed.    Harrington Challenger WHNP, 1:11 PM 09/12/2012

## 2012-09-13 ENCOUNTER — Other Ambulatory Visit: Payer: Self-pay | Admitting: Women's Health

## 2012-09-13 DIAGNOSIS — E7800 Pure hypercholesterolemia, unspecified: Secondary | ICD-10-CM

## 2012-09-13 LAB — URINALYSIS W MICROSCOPIC + REFLEX CULTURE
Bilirubin Urine: NEGATIVE
Casts: NONE SEEN
Crystals: NONE SEEN
Glucose, UA: NEGATIVE mg/dL
Hgb urine dipstick: NEGATIVE
Ketones, ur: NEGATIVE mg/dL
Leukocytes, UA: NEGATIVE
Nitrite: NEGATIVE
Protein, ur: NEGATIVE mg/dL
Specific Gravity, Urine: 1.023 (ref 1.005–1.030)
Urobilinogen, UA: 0.2 mg/dL (ref 0.0–1.0)
pH: 5 (ref 5.0–8.0)

## 2013-09-15 ENCOUNTER — Encounter: Payer: BC Managed Care – PPO | Admitting: Women's Health

## 2014-06-29 ENCOUNTER — Encounter: Payer: Self-pay | Admitting: Women's Health

## 2014-09-15 ENCOUNTER — Ambulatory Visit (INDEPENDENT_AMBULATORY_CARE_PROVIDER_SITE_OTHER): Payer: 59 | Admitting: Women's Health

## 2014-09-15 ENCOUNTER — Encounter: Payer: Self-pay | Admitting: Women's Health

## 2014-09-15 VITALS — BP 136/84 | Ht 68.0 in | Wt 194.0 lb

## 2014-09-15 DIAGNOSIS — R1031 Right lower quadrant pain: Secondary | ICD-10-CM

## 2014-09-15 DIAGNOSIS — Z13220 Encounter for screening for lipoid disorders: Secondary | ICD-10-CM

## 2014-09-15 DIAGNOSIS — N951 Menopausal and female climacteric states: Secondary | ICD-10-CM

## 2014-09-15 DIAGNOSIS — Z01419 Encounter for gynecological examination (general) (routine) without abnormal findings: Secondary | ICD-10-CM

## 2014-09-15 LAB — CBC WITH DIFFERENTIAL/PLATELET
Basophils Absolute: 0.1 10*3/uL (ref 0.0–0.1)
Basophils Relative: 1 % (ref 0–1)
Eosinophils Absolute: 0.1 10*3/uL (ref 0.0–0.7)
Eosinophils Relative: 2 % (ref 0–5)
HCT: 37 % (ref 36.0–46.0)
Hemoglobin: 12.3 g/dL (ref 12.0–15.0)
Lymphocytes Relative: 31 % (ref 12–46)
Lymphs Abs: 1.6 10*3/uL (ref 0.7–4.0)
MCH: 29.4 pg (ref 26.0–34.0)
MCHC: 33.2 g/dL (ref 30.0–36.0)
MCV: 88.3 fL (ref 78.0–100.0)
MPV: 10 fL (ref 8.6–12.4)
Monocytes Absolute: 0.5 10*3/uL (ref 0.1–1.0)
Monocytes Relative: 10 % (ref 3–12)
Neutro Abs: 2.9 10*3/uL (ref 1.7–7.7)
Neutrophils Relative %: 56 % (ref 43–77)
Platelets: 320 10*3/uL (ref 150–400)
RBC: 4.19 MIL/uL (ref 3.87–5.11)
RDW: 13.3 % (ref 11.5–15.5)
WBC: 5.1 10*3/uL (ref 4.0–10.5)

## 2014-09-15 LAB — LIPID PANEL
Cholesterol: 242 mg/dL — ABNORMAL HIGH (ref 0–200)
HDL: 66 mg/dL (ref >39)
LDL Cholesterol: 133 mg/dL — ABNORMAL HIGH (ref 0–99)
Total CHOL/HDL Ratio: 3.7 Ratio
Triglycerides: 213 mg/dL — ABNORMAL HIGH (ref <150)
VLDL: 43 mg/dL — ABNORMAL HIGH (ref 0–40)

## 2014-09-15 LAB — COMPREHENSIVE METABOLIC PANEL
ALT: 11 U/L (ref 0–35)
AST: 15 U/L (ref 0–37)
Albumin: 4.2 g/dL (ref 3.5–5.2)
Alkaline Phosphatase: 78 U/L (ref 39–117)
BUN: 12 mg/dL (ref 6–23)
CO2: 29 mEq/L (ref 19–32)
Calcium: 9.9 mg/dL (ref 8.4–10.5)
Chloride: 104 mEq/L (ref 96–112)
Creat: 0.73 mg/dL (ref 0.50–1.10)
Glucose, Bld: 74 mg/dL (ref 70–99)
Potassium: 4.4 mEq/L (ref 3.5–5.3)
Sodium: 142 mEq/L (ref 135–145)
Total Bilirubin: 0.3 mg/dL (ref 0.2–1.2)
Total Protein: 7 g/dL (ref 6.0–8.3)

## 2014-09-15 LAB — TSH: TSH: 1.646 u[IU]/mL (ref 0.350–4.500)

## 2014-09-15 MED ORDER — ESTRADIOL 0.05 MG/24HR TD PTWK: 0.0500 mg | MEDICATED_PATCH | TRANSDERMAL | Status: DC

## 2014-09-15 NOTE — Patient Instructions (Signed)

## 2014-09-15 NOTE — Progress Notes (Signed)
Megan Potts 1966-03-24 [id number]    History:    Presents for annual exam with 2 complaints. RLQ cramping type pain, severe at times with back pain almost daily x 1 month, nausea without vomiting, semi-relieved by ibuprofen. Denies urinary symptoms, constipation, fever, change in routine or back injury/strain.  Complains of hot flushes and insomnia. No bleeding. Same partner.  2004 TVH for atypia polyps. Normal mammogram and pap history.   Past medical history, past surgical history, family history and social history were all reviewed and documented in the EPIC chart. 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 2 kids going back to school.  Hypertension father.   ROS:  A ROS was performed and pertinent positives and negatives are included.  Exam:  Filed Vitals:   09/15/14 0940  BP: 136/84    General appearance:  Normal Thyroid:  Symmetrical, normal in size, without palpable masses or nodularity. Respiratory  Auscultation:  Clear without wheezing or rhonchi Cardiovascular  Auscultation:  Regular rate, without rubs, murmurs or gallops  Edema/varicosities:  Not grossly evident Abdominal  Soft without masses, guarding or rebound. RLQ tenderness with deep palpation.   Liver/spleen:  No organomegaly noted  Hernia:  None appreciated  Skin  Inspection:  Grossly normal   Breasts: Examined lying and sitting.     Right: Without masses, retractions, discharge or axillary adenopathy.     Left: Without masses, retractions, discharge or axillary adenopathy. Gentitourinary   Inguinal/mons:  Normal without inguinal adenopathy  External genitalia:  Normal  BUS/Urethra/Skene's glands:  Normal  Vagina:  Normal  Cervix:  Absent  Uterus:  Absent  Adnexa/parametria:     Rt: Without masses or tenderness.   Lt: Without masses or tenderness.  Anus and perineum: Normal  Digital rectal exam: Normal sphincter tone without palpated masses or tenderness  Assessment/Plan:  49 y.o. MWF G2  for annual exam with hot  flushes and RLQ pain  Right Lower Quadrant Pain  Menopausal symptoms/TVH  Plan: Schedule U/S to rule out ovarian causes. Menopausal symptom relief options reviewed, Climara 0.05 mg patch weekly, application instructions given, risk of blood clots, stroke  and breast cancer reviewed. Vit D 2000 units daily, SBE's,  overdue for mammogram, instructed to schedule,  decrease calories/increase exercise for weight loss encouraged. Colonoscopy encouraged. New pap guidelines reviewed. Lipid panel, CBC, FSH, TSH, UA, CMP.     YOUNG,NANCY J WHNP, 10:20 AM 09/15/2014

## 2014-09-16 LAB — URINALYSIS W MICROSCOPIC + REFLEX CULTURE
Bilirubin Urine: NEGATIVE
Casts: NONE SEEN
Crystals: NONE SEEN
Glucose, UA: NEGATIVE mg/dL
Hgb urine dipstick: NEGATIVE
Ketones, ur: NEGATIVE mg/dL
Leukocytes, UA: NEGATIVE
Nitrite: NEGATIVE
Protein, ur: NEGATIVE mg/dL
Specific Gravity, Urine: 1.012 (ref 1.005–1.030)
Urobilinogen, UA: 0.2 mg/dL (ref 0.0–1.0)
pH: 7 (ref 5.0–8.0)

## 2014-09-16 LAB — FOLLICLE STIMULATING HORMONE: FSH: 59.6 m[IU]/mL

## 2014-09-29 ENCOUNTER — Other Ambulatory Visit: Payer: Self-pay

## 2014-09-29 DIAGNOSIS — Z1231 Encounter for screening mammogram for malignant neoplasm of breast: Secondary | ICD-10-CM

## 2014-10-01 ENCOUNTER — Encounter: Payer: Self-pay | Admitting: Women's Health

## 2014-10-01 ENCOUNTER — Ambulatory Visit (INDEPENDENT_AMBULATORY_CARE_PROVIDER_SITE_OTHER): Payer: 59 | Admitting: Women's Health

## 2014-10-01 ENCOUNTER — Ambulatory Visit (INDEPENDENT_AMBULATORY_CARE_PROVIDER_SITE_OTHER): Payer: 59

## 2014-10-01 VITALS — BP 115/78 | Ht 68.0 in | Wt 194.0 lb

## 2014-10-01 DIAGNOSIS — R1031 Right lower quadrant pain: Secondary | ICD-10-CM

## 2014-10-01 NOTE — Progress Notes (Signed)
Patient ID: Megan Potts, female   DOB: [birth year], 49 y.o.   MRN: [id number] Sent for ultrasound. At annual exam had had intermittent right lower quadrant cramping pain with pain radiating to her back for several months. Denies urinary symptoms, vaginal discharge or fever. No nausea or vomiting. Started on estradiol 0.05 patch weekly at annual exam last month reports good relief of hot flushes and right lower quadrant pain has resolved.  Exam: Ultrasound status post hysterectomy, transvaginal and transabdominal images. Right and left ovary normal. Positive vascular flow within both ovaries. Negative cul-de-sac.  Postmenopausal good relief of symptoms with HRT  Plan: Continue estradiol 0.05 patch weekly, reviewed normality of ultrasound.

## 2014-10-13 ENCOUNTER — Encounter (INDEPENDENT_AMBULATORY_CARE_PROVIDER_SITE_OTHER): Payer: Self-pay

## 2014-10-13 ENCOUNTER — Ambulatory Visit
Admission: RE | Admit: 2014-10-13 | Discharge: 2014-10-13 | Disposition: A | Discharge status: Home / Self Care | Payer: 59 | Source: Ambulatory Visit

## 2014-10-13 DIAGNOSIS — Z1231 Encounter for screening mammogram for malignant neoplasm of breast: Secondary | ICD-10-CM

## 2015-01-07 ENCOUNTER — Ambulatory Visit (INDEPENDENT_AMBULATORY_CARE_PROVIDER_SITE_OTHER): Payer: 59 | Admitting: Gynecology

## 2015-01-07 ENCOUNTER — Encounter: Payer: Self-pay | Admitting: Gynecology

## 2015-01-07 ENCOUNTER — Telehealth: Payer: Self-pay | Admitting: *Deleted

## 2015-01-07 VITALS — BP 124/78

## 2015-01-07 DIAGNOSIS — N898 Other specified noninflammatory disorders of vagina: Secondary | ICD-10-CM | POA: Diagnosis not present

## 2015-01-07 DIAGNOSIS — N760 Acute vaginitis: Secondary | ICD-10-CM

## 2015-01-07 DIAGNOSIS — L298 Other pruritus: Secondary | ICD-10-CM | POA: Diagnosis not present

## 2015-01-07 DIAGNOSIS — B373 Candidiasis of vulva and vagina: Secondary | ICD-10-CM | POA: Diagnosis not present

## 2015-01-07 DIAGNOSIS — B9689 Other specified bacterial agents as the cause of diseases classified elsewhere: Secondary | ICD-10-CM

## 2015-01-07 DIAGNOSIS — A499 Bacterial infection, unspecified: Secondary | ICD-10-CM | POA: Diagnosis not present

## 2015-01-07 DIAGNOSIS — B3731 Acute candidiasis of vulva and vagina: Secondary | ICD-10-CM

## 2015-01-07 LAB — WET PREP FOR TRICH, YEAST, CLUE: Trich, Wet Prep: NONE SEEN

## 2015-01-07 MED ORDER — TINIDAZOLE 500 MG PO TABS: 500.0000 mg | ORAL_TABLET | Freq: Once | ORAL | Status: DC

## 2015-01-07 MED ORDER — FLUCONAZOLE 150 MG PO TABS: ORAL_TABLET | ORAL | Status: DC

## 2015-01-07 NOTE — Patient Instructions (Signed)
Fluconazole injection What is this medicine? FLUCONAZOLE (floo KON na zole) is an antifungal medicine. It is used to treat or prevent certain kinds of fungal or yeast infections. This medicine may be used for other purposes; ask your health care provider or pharmacist if you have questions. COMMON BRAND NAME(S): Diflucan What should I tell my health care provider before I take this medicine? They need to know if you have any of these conditions: -history of irregular heart beat -kidney disease -an unusual or allergic reaction to fluconazole, other antifungal medicines, foods, dyes or preservatives -pregnant or trying to get pregnant -breast-feeding How should I use this medicine? This medicine is for injection into a vein. It is usually given by a health care professional in a hospital or clinic setting. If you get this medicine at home, you will be taught how to prepare and give this medicine. Use exactly as directed. Take your medicine at regular intervals. Do not take your medicine more often than directed. It is important that you put your used needles and syringes in a special sharps container. Do not put them in a trash can. If you do not have a sharps container, call your pharmacist or healthcare provider to get one. Talk to your pediatrician regarding the use of this medicine in children. Special care may be needed. Overdosage: If you think you have taken too much of this medicine contact a poison control center or emergency room at once. NOTE: This medicine is only for you. Do not share this medicine with others. What if I miss a dose? This does not apply. What may interact with this medicine? Do not take this medicine with any of the following medications: -cisapride -pimozide -red yeast rice This medicine may also interact with the following medications: -birth control pills -cyclosporine -diuretics like hydrochlorothiazide -medicines for diabetes that are taken by  mouth -medicines for high cholesterol like atorvastatin, lovastatin or simvastatin -phenytoin -ramelteon -rifabutin -rifampin -some medicines for anxiety or sleep -tacrolimus -terfenadine -theophylline -tofacitinib -warfarin This list may not describe all possible interactions. Give your health care provider a list of all the medicines, herbs, non-prescription drugs, or dietary supplements you use. Also tell them if you smoke, drink alcohol, or use illegal drugs. Some items may interact with your medicine. What should I watch for while using this medicine? Tell your doctor if your symptoms do not improve. If you are taking this medicine for a long time you may need blood work. Some fungal infections need many weeks or months of treatment to cure completely. Alcohol can increase possible damage to your liver from this medicine. Avoid alcoholic drinks. What side effects may I notice from receiving this medicine? Side effects that you should report to your doctor or health care professional as soon as possible: -allergic reactions like skin rash or itching, hives, swelling of the lips, mouth, tongue, or throat -dark urine -feeling dizzy or faint -irregular heartbeat or chest pain -pain, redness at site of injection -redness, blistering, peeling or loosening of the skin, including inside the mouth -stomach pain -trouble breathing -unusual bruising or bleeding -vomiting -yellowing of the eyes or skin Side effects that usually do not require medical attention (report to your doctor or health care professional if they continue or are bothersome): -changes in how food tastes -diarrhea -headache -stomach upset, nausea This list may not describe all possible side effects. Call your doctor for medical advice about side effects. You may report side effects to FDA at 1-800-FDA-1088. Where should   I keep my medicine? Keep out of the reach of children. If you are using this medicine at home, you  will be instructed on how to store this medicine. Throw away any unused medicine after the expiration date on the label. NOTE: This sheet is a summary. It may not cover all possible information. If you have questions about this medicine, talk to your doctor, pharmacist, or health care provider.  2015, [employer] Standard. (2013-03-22 15:51:41) Tinidazole tablets What is this medicine? TINIDAZOLE (tye NI da zole) is an antiinfective. It is used to treat amebiasis, giardiasis, trichomoniasis, and vaginosis. It will not work for colds, flu, or other viral infections. This medicine may be used for other purposes; ask your health care provider or pharmacist if you have questions. COMMON BRAND NAME(S): Tindamax What should I tell my health care provider before I take this medicine? They need to know if you have any of these conditions: -anemia or other blood disorders -if you frequently drink alcohol containing drinks -receiving hemodialysis -seizure disorder -an unusual or allergic reaction to tinidazole, other medicines, foods, dyes, or preservatives -pregnant or trying to get pregnant -breast-feeding How should I use this medicine? Take this medicine by mouth with a full glass of water. Follow the directions on the prescription label. Take with food. Take your medicine at regular intervals. Do not take your medicine more often than directed. Take all of your medicine as directed even if you think you are better. Do not skip doses or stop your medicine early. Talk to your pediatrician regarding the use of this medicine in children. While this drug may be prescribed for children as young as 3 years of age for selected conditions, precautions do apply. Overdosage: If you think you have taken too much of this medicine contact a poison control center or emergency room at once. NOTE: This medicine is only for you. Do not share this medicine with others. What if I miss a dose? If you miss a dose,  take it as soon as you can. If it is almost time for your next dose, take only that dose. Do not take double or extra doses. What may interact with this medicine? Do not take this medicine with any of the following medications: -alcohol or any product that contains alcohol -amprenavir oral solution -disulfiram -paclitaxel injection -ritonavir oral solution -sertraline oral solution -sulfamethoxazole-trimethoprim injection This medicine may also interact with the following medications: -cholestyramine -cimetidine -conivaptan -cyclosporin -fluorouracil -fosphenytoin, phenytoin -ketoconazole -lithium -phenobarbital -tacrolimus -warfarin This list may not describe all possible interactions. Give your health care provider a list of all the medicines, herbs, non-prescription drugs, or dietary supplements you use. Also tell them if you smoke, drink alcohol, or use illegal drugs. Some items may interact with your medicine. What should I watch for while using this medicine? Tell your doctor or health care professional if your symptoms do not improve or if they get worse. Avoid alcoholic drinks while you are taking this medicine and for three days afterward. Alcohol may make you feel dizzy, sick, or flushed. If you are being treated for a sexually transmitted disease, avoid sexual contact until you have finished your treatment. Your sexual partner may also need treatment. What side effects may I notice from receiving this medicine? Side effects that you should report to your doctor or health care professional as soon as possible: -allergic reactions like skin rash, itching or hives, swelling of the face, lips, or tongue -breathing problems -confusion, depression -dark or white patches in   the mouth -feeling faint or lightheaded, falls -fever, infection -numbness, tingling, pain or weakness in the hands or feet -pain when passing urine -seizures -unusually weak or tired -vaginal irritation  or discharge -vomiting Side effects that usually do not require medical attention (report to your doctor or health care professional if they continue or are bothersome): -dark brown or reddish urine -diarrhea -headache -loss of appetite -metallic taste -nausea -stomach upset This list may not describe all possible side effects. Call your doctor for medical advice about side effects. You may report side effects to FDA at 1-800-FDA-1088. Where should I keep my medicine? Keep out of the reach of children. Store at room temperature between 15 and 30 degrees C (59 and 86 degrees F). Protect from light and moisture. Keep container tightly closed. Throw away any unused medicine after the expiration date. NOTE: This sheet is a summary. It may not cover all possible information. If you have questions about this medicine, talk to your doctor, pharmacist, or health care provider.  2015, [employer]/Gold Standard. (2008-05-11 15:22:28) Bacterial Vaginosis Bacterial vaginosis is a vaginal infection that occurs when the normal balance of bacteria in the vagina is disrupted. It results from an overgrowth of certain bacteria. This is the most common vaginal infection in women of childbearing age. Treatment is important to prevent complications, especially in pregnant women, as it can cause a premature delivery. CAUSES  Bacterial vaginosis is caused by an increase in harmful bacteria that are normally present in smaller amounts in the vagina. Several different kinds of bacteria can cause bacterial vaginosis. However, the reason that the condition develops is not fully understood. RISK FACTORS Certain activities or behaviors can put you at an increased risk of developing bacterial vaginosis, including:  Having a new sex partner or multiple sex partners.  Douching.  Using an intrauterine device (IUD) for contraception. Women do not get bacterial vaginosis from toilet seats, bedding, swimming pools, or contact  with objects around them. SIGNS AND SYMPTOMS  Some women with bacterial vaginosis have no signs or symptoms. Common symptoms include:  Grey vaginal discharge.  A fishlike odor with discharge, especially after sexual intercourse.  Itching or burning of the vagina and vulva.  Burning or pain with urination. DIAGNOSIS  Your health care provider will take a medical history and examine the vagina for signs of bacterial vaginosis. A sample of vaginal fluid may be taken. Your health care provider will look at this sample under a microscope to check for bacteria and abnormal cells. A vaginal pH test may also be done.  TREATMENT  Bacterial vaginosis may be treated with antibiotic medicines. These may be given in the form of a pill or a vaginal cream. A second round of antibiotics may be prescribed if the condition comes back after treatment.  HOME CARE INSTRUCTIONS   Only take over-the-counter or prescription medicines as directed by your health care provider.  If antibiotic medicine was prescribed, take it as directed. Make sure you finish it even if you start to feel better.  Do not have sex until treatment is completed.  Tell all sexual partners that you have a vaginal infection. They should see their health care provider and be treated if they have problems, such as a mild rash or itching.  Practice safe sex by using condoms and only having one sex partner. SEEK MEDICAL CARE IF:   Your symptoms are not improving after 3 days of treatment.  You have increased discharge or pain.  You have a   fever. MAKE SURE YOU:   Understand these instructions.  Will watch your condition.  Will get help right away if you are not doing well or get worse. FOR MORE INFORMATION  Centers for Disease Control and Prevention, Division of STD Prevention: www.cdc.gov/std American Sexual Health Association (ASHA): www.ashastd.org  Document Released: 08/14/2005 Document Revised: 06/04/2013 Document Reviewed:  03/26/2013 ExitCare Patient Information 2015 [employer]. This information is not intended to replace advice given to you by your health care provider. Make sure you discuss any questions you have with your health care provider. Monilial Vaginitis Vaginitis in a soreness, swelling and redness (inflammation) of the vagina and vulva. Monilial vaginitis is not a sexually transmitted infection. CAUSES  Yeast vaginitis is caused by yeast (candida) that is normally found in your vagina. With a yeast infection, the candida has overgrown in number to a point that upsets the chemical balance. SYMPTOMS   White, thick vaginal discharge.  Swelling, itching, redness and irritation of the vagina and possibly the lips of the vagina (vulva).  Burning or painful urination.  Painful intercourse. DIAGNOSIS  Things that may contribute to monilial vaginitis are:  Postmenopausal and virginal states.  Pregnancy.  Infections.  Being tired, sick or stressed, especially if you had monilial vaginitis in the past.  Diabetes. Good control will help lower the chance.  Birth control pills.  Tight fitting garments.  Using bubble bath, feminine sprays, douches or deodorant tampons.  Taking certain medications that kill germs (antibiotics).  Sporadic recurrence can occur if you become ill. TREATMENT  Your caregiver will give you medication.  There are several kinds of anti monilial vaginal creams and suppositories specific for monilial vaginitis. For recurrent yeast infections, use a suppository or cream in the vagina 2 times a week, or as directed.  Anti-monilial or steroid cream for the itching or irritation of the vulva may also be used. Get your caregiver's permission.  Painting the vagina with methylene blue solution may help if the monilial cream does not work.  Eating yogurt may help prevent monilial vaginitis. HOME CARE INSTRUCTIONS   Finish all medication as prescribed.  Do not have sex  until treatment is completed or after your caregiver tells you it is okay.  Take warm sitz baths.  Do not douche.  Do not use tampons, especially scented ones.  Wear cotton underwear.  Avoid tight pants and panty hose.  Tell your sexual partner that you have a yeast infection. They should go to their caregiver if they have symptoms such as mild rash or itching.  Your sexual partner should be treated as well if your infection is difficult to eliminate.  Practice safer sex. Use condoms.  Some vaginal medications cause latex condoms to fail. Vaginal medications that harm condoms are:  Cleocin cream.  Butoconazole (Femstat).  Terconazole (Terazol) vaginal suppository.  Miconazole (Monistat) (may be purchased over the counter). SEEK MEDICAL CARE IF:   You have a temperature by mouth above 102 F (38.9 C).  The infection is getting worse after 2 days of treatment.  The infection is not getting better after 3 days of treatment.  You develop blisters in or around your vagina.  You develop vaginal bleeding, and it is not your menstrual period.  You have pain when you urinate.  You develop intestinal problems.  You have pain with sexual intercourse. Document Released: 05/24/2005 Document Revised: 11/06/2011 Document Reviewed: 02/05/2009 ExitCare Patient Information 2015 [employer]. This information is not intended to replace advice given to you by   your health care provider. Make sure you discuss any questions you have with your health care provider.  

## 2015-01-07 NOTE — Progress Notes (Signed)
   49 year old patient presented to the office complaining of vaginal pruritus and discharge. She tried Monistat for one day with minimal resolution. She denies any fever, chills, nausea, vomiting or back pain. Patient with prior hysterectomy on hormone replacement therapy.  Exam: Blood pressure 124/78 Pelvic exam: Bartholin urethra Skene was within normal limits Vagina: Thick white discharge was noted irritated vaginal epithelium Vaginal cuff intact Bimanual exam not done Rectal vaginal exam not done  Wet prep: Yeast too numerous to count Positive amine Moderate clue cells Too numerous to count WBC Too numerous to count bacteria  Assessment/plan: Patient with these vaginitis and bacterial vaginosis will be treated with Diflucan 150 mg today as well as Tindamax 500 mg tablets she is to take 4 tablets today and repeat in 48 hours.

## 2015-01-07 NOTE — Telephone Encounter (Signed)
Pharmacy called regarding the tindamax 500 mg #8 take 4 today and repeat in 24 hours direction given to pharmacy

## 2015-09-27 ENCOUNTER — Other Ambulatory Visit: Payer: Self-pay

## 2015-09-27 DIAGNOSIS — Z1231 Encounter for screening mammogram for malignant neoplasm of breast: Secondary | ICD-10-CM

## 2015-10-15 ENCOUNTER — Ambulatory Visit: Payer: Self-pay

## 2015-10-29 ENCOUNTER — Ambulatory Visit
Admission: RE | Admit: 2015-10-29 | Discharge: 2015-10-29 | Disposition: A | Discharge status: Home / Self Care | Payer: Managed Care, Other (non HMO) | Source: Ambulatory Visit

## 2015-10-29 DIAGNOSIS — Z1231 Encounter for screening mammogram for malignant neoplasm of breast: Secondary | ICD-10-CM

## 2016-09-28 ENCOUNTER — Other Ambulatory Visit: Payer: Self-pay | Admitting: Women's Health

## 2016-09-28 DIAGNOSIS — Z1231 Encounter for screening mammogram for malignant neoplasm of breast: Secondary | ICD-10-CM

## 2016-11-03 ENCOUNTER — Ambulatory Visit
Admission: RE | Admit: 2016-11-03 | Discharge: 2016-11-03 | Disposition: A | Discharge status: Home / Self Care | Payer: 59 | Source: Ambulatory Visit | Attending: Women's Health | Admitting: Women's Health

## 2016-11-03 DIAGNOSIS — Z1231 Encounter for screening mammogram for malignant neoplasm of breast: Secondary | ICD-10-CM

## 2016-11-07 ENCOUNTER — Other Ambulatory Visit: Payer: Self-pay | Admitting: Women's Health

## 2016-11-07 DIAGNOSIS — R928 Other abnormal and inconclusive findings on diagnostic imaging of breast: Secondary | ICD-10-CM

## 2016-11-17 ENCOUNTER — Ambulatory Visit
Admission: RE | Admit: 2016-11-17 | Discharge: 2016-11-17 | Disposition: A | Discharge status: Home / Self Care | Payer: 59 | Source: Ambulatory Visit | Attending: Women's Health | Admitting: Women's Health

## 2016-11-17 DIAGNOSIS — R928 Other abnormal and inconclusive findings on diagnostic imaging of breast: Secondary | ICD-10-CM

## 2017-01-10 ENCOUNTER — Encounter: Payer: Self-pay | Admitting: Gynecology

## 2017-04-09 ENCOUNTER — Other Ambulatory Visit: Payer: Self-pay | Admitting: Women's Health

## 2017-04-09 DIAGNOSIS — N6489 Other specified disorders of breast: Secondary | ICD-10-CM

## 2017-05-25 ENCOUNTER — Ambulatory Visit
Admission: RE | Admit: 2017-05-25 | Discharge: 2017-05-25 | Disposition: A | Discharge status: Home / Self Care | Payer: 59 | Source: Ambulatory Visit | Attending: Women's Health | Admitting: Women's Health

## 2017-05-25 ENCOUNTER — Other Ambulatory Visit: Payer: Self-pay | Admitting: Women's Health

## 2017-05-25 DIAGNOSIS — N6489 Other specified disorders of breast: Secondary | ICD-10-CM

## 2017-05-25 DIAGNOSIS — N63 Unspecified lump in unspecified breast: Secondary | ICD-10-CM

## 2017-10-31 ENCOUNTER — Ambulatory Visit: Payer: 59 | Admitting: Women's Health

## 2017-10-31 ENCOUNTER — Encounter: Payer: Self-pay | Admitting: Women's Health

## 2017-10-31 VITALS — BP 124/80 | Ht 69.0 in | Wt 204.0 lb

## 2017-10-31 DIAGNOSIS — N949 Unspecified condition associated with female genital organs and menstrual cycle: Secondary | ICD-10-CM

## 2017-10-31 NOTE — Progress Notes (Signed)
52 year old  MWF G2 P2 hysterectomy on  HRT with questionable lump/knot in left vaginal area felt yesterday per husband. No discharge, abdominal pain, urinary symptoms, leakage of urine, vaginal pain, or fever.  Exam: Appears well, slightly anxious, abdomen soft, nontender, external genitalia within normal limits, mild +1 cystocele, digital vaginal exam with no noted protrusions, knot or lump. Speculum exam no visible discharge, vaginal walls weak, bimanual no adnexal tenderness, reviewed normality of exam.  Plan: Reviewed normality of exam . Reassurance given. Instructed to schedule annual exam overdue,

## 2017-11-30 ENCOUNTER — Ambulatory Visit
Admission: RE | Admit: 2017-11-30 | Discharge: 2017-11-30 | Disposition: A | Discharge status: Home / Self Care | Payer: 59 | Source: Ambulatory Visit | Attending: Women's Health | Admitting: Women's Health

## 2017-11-30 DIAGNOSIS — N63 Unspecified lump in unspecified breast: Secondary | ICD-10-CM

## 2018-01-22 ENCOUNTER — Encounter: Payer: Self-pay | Admitting: Women's Health

## 2018-03-13 ENCOUNTER — Ambulatory Visit (INDEPENDENT_AMBULATORY_CARE_PROVIDER_SITE_OTHER): Payer: 59 | Admitting: Women's Health

## 2018-03-13 ENCOUNTER — Encounter: Payer: Self-pay | Admitting: Women's Health

## 2018-03-13 VITALS — BP 118/76 | Ht 69.0 in | Wt 199.0 lb

## 2018-03-13 DIAGNOSIS — Z01419 Encounter for gynecological examination (general) (routine) without abnormal findings: Secondary | ICD-10-CM | POA: Diagnosis not present

## 2018-03-13 MED ORDER — ESTRADIOL 0.5 MG PO TABS: 0.5000 mg | ORAL_TABLET | Freq: Every day | ORAL | 4 refills | Status: DC

## 2018-03-13 NOTE — Patient Instructions (Signed)
Health Maintenance for Postmenopausal Women Menopause is a normal process in which your reproductive ability comes to an end. This process happens gradually over a span of months to years, usually between the ages of 22 and 9. Menopause is complete when you have missed 12 consecutive menstrual periods. It is important to talk with your health care provider about some of the most common conditions that affect postmenopausal women, such as heart disease, cancer, and bone loss (osteoporosis). Adopting a healthy lifestyle and getting preventive care can help to promote your health and wellness. Those actions can also lower your chances of developing some of these common conditions. What should I know about menopause? During menopause, you may experience a number of symptoms, such as:  Moderate-to-severe hot flashes.  Night sweats.  Decrease in sex drive.  Mood swings.  Headaches.  Tiredness.  Irritability.  Memory problems.  Insomnia.  Choosing to treat or not to treat menopausal changes is an individual decision that you make with your health care provider. What should I know about hormone replacement therapy and supplements? Hormone therapy products are effective for treating symptoms that are associated with menopause, such as hot flashes and night sweats. Hormone replacement carries certain risks, especially as you become older. If you are thinking about using estrogen or estrogen with progestin treatments, discuss the benefits and risks with your health care provider. What should I know about heart disease and stroke? Heart disease, heart attack, and stroke become more likely as you age. This may be due, in part, to the hormonal changes that your body experiences during menopause. These can affect how your body processes dietary fats, triglycerides, and cholesterol. Heart attack and stroke are both medical emergencies. There are many things that you can do to help prevent heart disease  and stroke:  Have your blood pressure checked at least every 1-2 years. High blood pressure causes heart disease and increases the risk of stroke.  If you are 53-22 years old, ask your health care provider if you should take aspirin to prevent a heart attack or a stroke.  Do not use any tobacco products, including cigarettes, chewing tobacco, or electronic cigarettes. If you need help quitting, ask your health care provider.  It is important to eat a healthy diet and maintain a healthy weight. ? Be sure to include plenty of vegetables, fruits, low-fat dairy products, and lean protein. ? Avoid eating foods that are high in solid fats, added sugars, or salt (sodium).  Get regular exercise. This is one of the most important things that you can do for your health. ? Try to exercise for at least 150 minutes each week. The type of exercise that you do should increase your heart rate and make you sweat. This is known as moderate-intensity exercise. ? Try to do strengthening exercises at least twice each week. Do these in addition to the moderate-intensity exercise.  Know your numbers.Ask your health care provider to check your cholesterol and your blood glucose. Continue to have your blood tested as directed by your health care provider.  What should I know about cancer screening? There are several types of cancer. Take the following steps to reduce your risk and to catch any cancer development as early as possible. Breast Cancer  Practice breast self-awareness. ? This means understanding how your breasts normally appear and feel. ? It also means doing regular breast self-exams. Let your health care provider know about any changes, no matter how small.  If you are 40  or older, have a clinician do a breast exam (clinical breast exam or CBE) every year. Depending on your age, family history, and medical history, it may be recommended that you also have a yearly breast X-ray (mammogram).  If you  have a family history of breast cancer, talk with your health care provider about genetic screening.  If you are at high risk for breast cancer, talk with your health care provider about having an MRI and a mammogram every year.  Breast cancer (BRCA) gene test is recommended for women who have family members with BRCA-related cancers. Results of the assessment will determine the need for genetic counseling and BRCA1 and for BRCA2 testing. BRCA-related cancers include these types: ? Breast. This occurs in males or females. ? Ovarian. ? Tubal. This may also be called fallopian tube cancer. ? Cancer of the abdominal or pelvic lining (peritoneal cancer). ? Prostate. ? Pancreatic.  Cervical, Uterine, and Ovarian Cancer Your health care provider may recommend that you be screened regularly for cancer of the pelvic organs. These include your ovaries, uterus, and vagina. This screening involves a pelvic exam, which includes checking for microscopic changes to the surface of your cervix (Pap test).  For women ages 21-65, health care providers may recommend a pelvic exam and a Pap test every three years. For women ages 79-65, they may recommend the Pap test and pelvic exam, combined with testing for human papilloma virus (HPV), every five years. Some types of HPV increase your risk of cervical cancer. Testing for HPV may also be done on women of any age who have unclear Pap test results.  Other health care providers may not recommend any screening for nonpregnant women who are considered low risk for pelvic cancer and have no symptoms. Ask your health care provider if a screening pelvic exam is right for you.  If you have had past treatment for cervical cancer or a condition that could lead to cancer, you need Pap tests and screening for cancer for at least 20 years after your treatment. If Pap tests have been discontinued for you, your risk factors (such as having a new sexual partner) need to be  reassessed to determine if you should start having screenings again. Some women have medical problems that increase the chance of getting cervical cancer. In these cases, your health care provider may recommend that you have screening and Pap tests more often.  If you have a family history of uterine cancer or ovarian cancer, talk with your health care provider about genetic screening.  If you have vaginal bleeding after reaching menopause, tell your health care provider.  There are currently no reliable tests available to screen for ovarian cancer.  Lung Cancer Lung cancer screening is recommended for adults 69-62 years old who are at high risk for lung cancer because of a history of smoking. A yearly low-dose CT scan of the lungs is recommended if you:  Currently smoke.  Have a history of at least 30 pack-years of smoking and you currently smoke or have quit within the past 15 years. A pack-year is smoking an average of one pack of cigarettes per day for one year.  Yearly screening should:  Continue until it has been 15 years since you quit.  Stop if you develop a health problem that would prevent you from having lung cancer treatment.  Colorectal Cancer  This type of cancer can be detected and can often be prevented.  Routine colorectal cancer screening usually begins at  age 42 and continues through age 45.  If you have risk factors for colon cancer, your health care provider may recommend that you be screened at an earlier age.  If you have a family history of colorectal cancer, talk with your health care provider about genetic screening.  Your health care provider may also recommend using home test kits to check for hidden blood in your stool.  A small camera at the end of a tube can be used to examine your colon directly (sigmoidoscopy or colonoscopy). This is done to check for the earliest forms of colorectal cancer.  Direct examination of the colon should be repeated every  5-10 years until age 71. However, if early forms of precancerous polyps or small growths are found or if you have a family history or genetic risk for colorectal cancer, you may need to be screened more often.  Skin Cancer  Check your skin from head to toe regularly.  Monitor any moles. Be sure to tell your health care provider: ? About any new moles or changes in moles, especially if there is a change in a mole's shape or color. ? If you have a mole that is larger than the size of a pencil eraser.  If any of your family members has a history of skin cancer, especially at a [place] age, talk with your health care provider about genetic screening.  Always use sunscreen. Apply sunscreen liberally and repeatedly throughout the day.  Whenever you are outside, protect yourself by wearing long sleeves, pants, a wide-brimmed hat, and sunglasses.  What should I know about osteoporosis? Osteoporosis is a condition in which bone destruction happens more quickly than new bone creation. After menopause, you may be at an increased risk for osteoporosis. To help prevent osteoporosis or the bone fractures that can happen because of osteoporosis, the following is recommended:  If you are 46-71 years old, get at least 1,000 mg of calcium and at least 600 mg of vitamin D per day.  If you are older than age 55 but younger than age 65, get at least 1,200 mg of calcium and at least 600 mg of vitamin D per day.  If you are older than age 54, get at least 1,200 mg of calcium and at least 800 mg of vitamin D per day.  Smoking and excessive alcohol intake increase the risk of osteoporosis. Eat foods that are rich in calcium and vitamin D, and do weight-bearing exercises several times each week as directed by your health care provider. What should I know about how menopause affects my mental health? Depression may occur at any age, but it is more common as you become older. Common symptoms of depression  include:  Low or sad mood.  Changes in sleep patterns.  Changes in appetite or eating patterns.  Feeling an overall lack of motivation or enjoyment of activities that you previously enjoyed.  Frequent crying spells.  Talk with your health care provider if you think that you are experiencing depression. What should I know about immunizations? It is important that you get and maintain your immunizations. These include:  Tetanus, diphtheria, and pertussis (Tdap) booster vaccine.  Influenza every year before the flu season begins.  Pneumonia vaccine.  Shingles vaccine.  Your health care provider may also recommend other immunizations. This information is not intended to replace advice given to you by your health care provider. Make sure you discuss any questions you have with your health care provider. Document Released: 10/06/2005  Document Revised: 03/03/2016 Document Reviewed: 05/18/2015 Elsevier Interactive Patient Education  2018 [employer]  

## 2018-03-13 NOTE — Progress Notes (Signed)
Megan HowardSherri D Glassberg 02-Nov-1965 [id number]    History:    Presents for annual exam.  2004 TVH for fibroids on estradiol.    Normal Pap and mammogram history.  Negative colonoscopy at age [age].  Primary care manages l hypothyroidism, hypercholesteremia labs and meds.  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  Father hypertension, mother healthy.  ROS:  A ROS was performed and pertinent positives and negatives are included.  Exam:  Vitals:   03/13/18 1058  BP: 118/76  Weight: 199 lb (90.3 kg)  Height: 5\' 9"  (1.753 m)   Body mass index is 29.39 kg/m.   General appearance:  Normal Thyroid:  Symmetrical, normal in size, without palpable masses or nodularity. Respiratory  Auscultation:  Clear without wheezing or rhonchi Cardiovascular  Auscultation:  Regular rate, without rubs, murmurs or gallops  Edema/varicosities:  Not grossly evident Abdominal  Soft,nontender, without masses, guarding or rebound.  Liver/spleen:  No organomegaly noted  Hernia:  None appreciated  Skin  Inspection:  Grossly normal   Breasts: Examined lying and sitting.     Right: Without masses, retractions, discharge or axillary adenopathy.     Left: Without masses, retractions, discharge or axillary adenopathy. Gentitourinary   Inguinal/mons:  Normal without inguinal adenopathy  External genitalia:  Normal  BUS/Urethra/Skene's glands:  Normal  Vagina:  Normal  Cervix: And uterus absent  Adnexa/parametria:     Rt: Without masses or tenderness.   Lt: Without masses or tenderness.  Anus and perineum: Normal  Digital rectal exam: Normal sphincter tone without palpated masses or tenderness  Assessment/Plan:  52 y.o. MWF G2, P2 for annual exam without complaint.  2004 TVH on HRT Hypothyroid, hypercholesteremia-primary care manages labs and meds  Plan: HRT risks of blood clots,  strokes and breast cancer reviewed, best to use shortest amount of time, has been on estradiol 1 mg , will try estradiol 0.05, prescription, proper use given and reviewed, instructed to call if problems with change.  SBE's, continue annual screening mammogram, calcium rich foods, is on a vitamin D supplement per primary care.  Reviewed importance of weightbearing and balance type exercise exercise.   Harrington Challengerancy J Young [facility]NP, 11:38 AM 03/13/2018

## 2019-01-06 ENCOUNTER — Other Ambulatory Visit: Payer: Self-pay | Admitting: Women's Health

## 2019-01-06 DIAGNOSIS — N6310 Unspecified lump in the right breast, unspecified quadrant: Secondary | ICD-10-CM

## 2019-01-24 ENCOUNTER — Ambulatory Visit
Admission: RE | Admit: 2019-01-24 | Discharge: 2019-01-24 | Disposition: A | Discharge status: Home / Self Care | Payer: 59 | Source: Ambulatory Visit | Attending: Women's Health | Admitting: Women's Health

## 2019-01-24 ENCOUNTER — Other Ambulatory Visit: Payer: Self-pay

## 2019-01-24 DIAGNOSIS — N6310 Unspecified lump in the right breast, unspecified quadrant: Secondary | ICD-10-CM

## 2019-04-08 ENCOUNTER — Other Ambulatory Visit: Payer: Self-pay

## 2019-04-08 MED ORDER — ESTRADIOL 0.5 MG PO TABS: 0.5000 mg | ORAL_TABLET | Freq: Every day | ORAL | 0 refills | Status: DC

## 2019-04-08 NOTE — Telephone Encounter (Signed)
I staff messaged Butch Penny to call patient and schedule CE since past due.

## 2019-05-13 ENCOUNTER — Other Ambulatory Visit: Payer: Self-pay

## 2019-05-14 ENCOUNTER — Encounter: Payer: Self-pay | Admitting: Women's Health

## 2019-05-14 ENCOUNTER — Ambulatory Visit (INDEPENDENT_AMBULATORY_CARE_PROVIDER_SITE_OTHER): Payer: 59 | Admitting: Women's Health

## 2019-05-14 VITALS — BP 120/82 | Ht 69.0 in | Wt 207.0 lb

## 2019-05-14 DIAGNOSIS — Z79890 Hormone replacement therapy (postmenopausal): Secondary | ICD-10-CM | POA: Diagnosis not present

## 2019-05-14 DIAGNOSIS — Z01419 Encounter for gynecological examination (general) (routine) without abnormal findings: Secondary | ICD-10-CM | POA: Diagnosis not present

## 2019-05-14 DIAGNOSIS — E039 Hypothyroidism, unspecified: Secondary | ICD-10-CM | POA: Insufficient documentation

## 2019-05-14 MED ORDER — ESTRADIOL 0.5 MG PO TABS: 0.5000 mg | ORAL_TABLET | Freq: Every day | ORAL | 4 refills | Status: DC

## 2019-05-14 NOTE — Progress Notes (Signed)
Megan Potts 1966-04-20 [id number]    History:    Presents for annual exam.  2004 TVH for fibroids on estradiol with occasional hot flushes.  Normal Pap and mammogram history.  12/2018 mammogram normal after ultrasound.  Negative colonoscopy at age 53.  Primary care manages hypothyroidism and hypercholesteremia.  Has had some back issues chiropractic manipulation has helped.  Past medical history, past surgical history, family history and social history were all reviewed and documented in the EPIC chart.  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  ROS:  A ROS was performed and pertinent positives and negatives are included.  Exam:  Vitals:   05/14/19 0758  BP: 120/82  Weight: 207 lb (93.9 kg)  Height: 5\' 9"  (1.753 m)   Body mass index is 30.57 kg/m.   General appearance:  Normal Thyroid:  Symmetrical, normal in size, without palpable masses or nodularity. Respiratory  Auscultation:  Clear without wheezing or rhonchi Cardiovascular  Auscultation:  Regular rate, without rubs, murmurs or gallops  Edema/varicosities:  Not grossly evident Abdominal  Soft,nontender, without masses, guarding or rebound.  Liver/spleen:  No organomegaly noted  Hernia:  None appreciated  Skin  Inspection:  Grossly normal   Breasts: Examined lying and sitting.     Right: Without masses, retractions, discharge or axillary adenopathy.     Left: Without masses, retractions, discharge or axillary adenopathy. Gentitourinary   Inguinal/mons:  Normal without inguinal adenopathy  External genitalia:  Normal  BUS/Urethra/Skene's glands:  Normal  Vagina:  Normal  Cervix: And uterus absent   Adnexa/parametria:     Rt: Without masses or tenderness.   Lt: Without masses or tenderness.  Anus and perineum: Normal  Digital rectal exam: Normal sphincter tone without palpated masses or tenderness  Assessment/Plan:  53 y.o. MWF G2, P2 for annual exam with no  complaints.  2004 TVH for fibroids on estradiol with occasional hot flushes Hypothyroidism, hypercholesteremia-primary care manages labs and meds  Plan: SBEs, continue annual 3D screening mammogram, calcium rich foods, vitamin D 2000 daily encouraged after completing 5000 IUs weekly from primary care.  Reviewed importance of weightbearing and balance type exercise, home safety and fall prevention discussed.  DEXA in a year or so.  Aware of need to decrease calorie/carbs.  Pap screening guidelines reviewed.     Huel Cote [facility], 8:13 AM 05/14/2019

## 2019-05-14 NOTE — Patient Instructions (Signed)

## 2019-10-08 ENCOUNTER — Ambulatory Visit (INDEPENDENT_AMBULATORY_CARE_PROVIDER_SITE_OTHER): Payer: BLUE CROSS/BLUE SHIELD | Admitting: Bariatrics

## 2019-10-08 ENCOUNTER — Other Ambulatory Visit: Payer: Self-pay

## 2019-10-08 ENCOUNTER — Encounter (INDEPENDENT_AMBULATORY_CARE_PROVIDER_SITE_OTHER): Payer: Self-pay | Admitting: Bariatrics

## 2019-10-08 VITALS — BP 129/76 | HR 71 | Temp 98.5°F | Ht 69.0 in | Wt 205.0 lb

## 2019-10-08 DIAGNOSIS — R0602 Shortness of breath: Secondary | ICD-10-CM | POA: Diagnosis not present

## 2019-10-08 DIAGNOSIS — E559 Vitamin D deficiency, unspecified: Secondary | ICD-10-CM

## 2019-10-08 DIAGNOSIS — R5383 Other fatigue: Secondary | ICD-10-CM | POA: Diagnosis not present

## 2019-10-08 DIAGNOSIS — E7800 Pure hypercholesterolemia, unspecified: Secondary | ICD-10-CM

## 2019-10-08 DIAGNOSIS — Z9189 Other specified personal risk factors, not elsewhere classified: Secondary | ICD-10-CM | POA: Diagnosis not present

## 2019-10-08 DIAGNOSIS — E038 Other specified hypothyroidism: Secondary | ICD-10-CM | POA: Diagnosis not present

## 2019-10-08 DIAGNOSIS — Z0289 Encounter for other administrative examinations: Secondary | ICD-10-CM

## 2019-10-08 DIAGNOSIS — Z6830 Body mass index (BMI) 30.0-30.9, adult: Secondary | ICD-10-CM

## 2019-10-08 DIAGNOSIS — E669 Obesity, unspecified: Secondary | ICD-10-CM

## 2019-10-08 DIAGNOSIS — Z1331 Encounter for screening for depression: Secondary | ICD-10-CM | POA: Diagnosis not present

## 2019-10-08 NOTE — Progress Notes (Signed)
Chief Complaint:   OBESITY Megan Potts (MR# [id number]) is a 54 y.o. female who presents for evaluation and treatment of obesity and related comorbidities. Current BMI is Body mass index is 30.27 kg/m.Marland Kitchen Megan Potts has been struggling with her weight for many years and has been unsuccessful in either losing weight, maintaining weight loss, or reaching her healthy weight goal.  Megan Potts is currently in the action stage of change and ready to dedicate time achieving and maintaining a healthier weight. Megan Potts is interested in becoming our patient and working on intensive lifestyle modifications including (but not limited to) diet and exercise for weight loss.  Megan Potts states that she likes to [employer]. She craves chocolate and sweets. She snacks on protein, nuts, and chips.  Megan Potts's habits were reviewed today and are as follows: Her family eats meals together, she thinks her family will eat healthier with her, her desired weight loss is 55 lbs, she has been heavy most of her life, she started gaining weight in 2013-2014, her heaviest weight ever was 205 pounds, she craves chocolate or sweets, she snacks frequently in the evenings, she has binge eating behaviors and she struggles with emotional eating.  Depression Screen Megan Potts's Food and Mood (modified PHQ-9) score was 10.  Depression screen PHQ 2/9 10/08/2019  Decreased Interest 1  Down, Depressed, Hopeless 1  PHQ - 2 Score 2  Altered sleeping 2  Tired, decreased energy 2  Change in appetite 2  Feeling bad or failure about yourself  1  Trouble concentrating 1  Moving slowly or fidgety/restless 0  Suicidal thoughts 0  PHQ-9 Score 10  Difficult doing work/chores Not difficult at all   Subjective:   Other fatigue. Megan Potts admits to daytime somnolence and reports being refreshed if sleeps all night but when wakes up hot then is still tired. Patent has a history of symptoms of daytime fatigue. Megan Potts generally gets 6-7 hours of sleep per night,  and states that she does not sleep well most nights. Snoring is present. Apneic episodes are present. Epworth Sleepiness Score is 8.  Shortness of breath on exertion. Megan Potts notes increasing shortness of breath with certain activities and seems to be worsening over time with weight gain. She notes getting out of breath sooner with activity than she used to. This has gotten worse recently. Megan Potts denies shortness of breath at rest or orthopnea.  Vitamin D deficiency. Megan Potts is taking high dose Vitamin D.  Other specified hypothyroidism. Megan Potts is taking Synthroid.  Lab Results  Component Value Date   TSH 1.646 09/15/2014   High cholesterol. Megan Potts states she has mild elevation in her cholesterol.   Depression screening. Megan Potts had a moderately positive depression screen with a PHQ-9 score of 10.  At risk for heart disease. Megan Potts is at a higher than average risk for cardiovascular disease due to elevated cholesterol and obesity. Reviewed: no chest pain on exertion, no dyspnea on exertion, and no swelling of ankles.  Assessment/Plan:   Other fatigue. Megan Potts does feel that her weight is causing her energy to be lower than it should be. Fatigue may be related to obesity, depression or many other causes. Labs will be ordered, and in the meanwhile, Megan Potts will focus on self care including making healthy food choices, increasing physical activity and focusing on stress reduction. EKG 12-Lead, Comprehensive metabolic panel, Hemoglobin A1c, Insulin, random labs ordered.  Shortness of breath on exertion. [name] does feel that she gets out of breath more easily that she used to  when she exercises. [name]'s shortness of breath appears to be obesity related and exercise induced. She has agreed to work on weight loss and gradually increase exercise to treat her exercise induced shortness of breath. Will continue to monitor closely.  Vitamin D deficiency. Low Vitamin D level contributes to fatigue and are  associated with obesity, breast, and colon cancer. VITAMIN D 25 Hydroxy (Vit-D Deficiency, Fractures) level ordered.  Other specified hypothyroidism. Patient with long-standing hypothyroidism, on levothyroxine therapy. She appears euthyroid. Orders and follow up as documented in patient record. Megan Potts will continue Synthroid. T3, T4, free, TSH levels ordered.  Counseling . Good thyroid control is important for overall health. Supratherapeutic thyroid levels are dangerous and will not improve weight loss results. . The correct way to take levothyroxine is fasting, with water, separated by at least 30 minutes from breakfast, and separated by more than 4 hours from calcium, iron, multivitamins, acid reflux medications (PPIs).         High cholesterol. Megan Potts will work on weight loss and will decrease trans and saturated fats. Lipid Panel With LDL/HDL Ratio ordered.   Depression screening. Megan Potts had a positive depression screening. Depression is commonly associated with obesity and often results in emotional eating behaviors. We will monitor this closely and work on CBT to help improve the non-hunger eating patterns. Referral to Psychology may be required if no improvement is seen as she continues in our clinic.  At risk for heart disease. Megan Potts was given approximately 15 minutes of coronary artery disease prevention counseling today. She is 54 y.o. female and has risk factors for heart disease including obesity. We discussed intensive lifestyle modifications today with an emphasis on specific weight loss instructions and strategies.   Repetitive spaced learning was employed today to elicit superior memory formation and behavioral change.  Class 1 obesity with serious comorbidity and body mass index (BMI) of 30.0 to 30.9 in adult, unspecified obesity type.  Megan Potts is currently in the action stage of change and her goal is to continue with weight loss efforts. I recommend Megan Potts begin the structured  treatment plan as follows:  She has agreed to the Category 3 Plan.  She will work on meal planning.  Exercise goals: All adults should avoid inactivity. Some physical activity is better than none, and adults who participate in any amount of physical activity gain some health benefits.   Behavioral modification strategies: increasing lean protein intake, decreasing simple carbohydrates, increasing vegetables, increasing water intake, decreasing eating out, no skipping meals, meal planning and cooking strategies and keeping healthy foods in the home.  She was informed of the importance of frequent follow-up visits to maximize her success with intensive lifestyle modifications for her multiple health conditions. She was informed we would discuss her lab results at her next visit unless there is a critical issue that needs to be addressed sooner. Megan Potts agreed to keep her next visit at the agreed upon time to discuss these results.  Objective:   Blood pressure 129/76, pulse 71, temperature 98.5 F (36.9 C), height 5\' 9"  (1.753 m), weight 205 lb (93 kg), SpO2 97 %. Body mass index is 30.27 kg/m.  EKG: Sinus  Rhythm with a rate of 74 BPM. Low voltage in precordial leads. Nonspecific T-abnormality. Abnormal.   Indirect Calorimeter completed today shows a VO2 of 276 and a REE of 1922.  Her calculated basal metabolic rate is thus her basal metabolic rate is better than expected.  General: Cooperative, alert, well developed, in no  acute distress. HEENT: Conjunctivae and lids unremarkable. Cardiovascular: Regular rhythm.  Lungs: Normal work of breathing. Neurologic: No focal deficits.   Lab Results  Component Value Date   CREATININE 0.73 09/15/2014   BUN 12 09/15/2014   NA 142 09/15/2014   K 4.4 09/15/2014   CL 104 09/15/2014   CO2 29 09/15/2014   Lab Results  Component Value Date   ALT 11 09/15/2014   AST 15 09/15/2014   ALKPHOS 78 09/15/2014   BILITOT 0.3 09/15/2014   No  results found for: HGBA1C No results found for: INSULIN Lab Results  Component Value Date   TSH 1.646 09/15/2014   Lab Results  Component Value Date   CHOL 242 (H) 09/15/2014   HDL 66 09/15/2014   LDLCALC 133 (H) 09/15/2014   TRIG 213 (H) 09/15/2014   CHOLHDL 3.7 09/15/2014   Lab Results  Component Value Date   WBC 5.1 09/15/2014   HGB 12.3 09/15/2014   HCT 37.0 09/15/2014   MCV 88.3 09/15/2014   PLT 320 09/15/2014   No results found for: IRON, TIBC, FERRITIN  Attestation Statements:   Reviewed by clinician on day of visit: allergies, medications, problem list, medical history, surgical history, family history, social history, and previous encounter notes.  Fernanda Drum, am acting as Energy manager for [employer], DO   I have reviewed the above documentation for accuracy and completeness, and I agree with the above. Megan Capra, DO

## 2019-10-09 ENCOUNTER — Encounter (INDEPENDENT_AMBULATORY_CARE_PROVIDER_SITE_OTHER): Payer: Self-pay | Admitting: Bariatrics

## 2019-10-09 DIAGNOSIS — E559 Vitamin D deficiency, unspecified: Secondary | ICD-10-CM | POA: Insufficient documentation

## 2019-10-09 DIAGNOSIS — E782 Mixed hyperlipidemia: Secondary | ICD-10-CM | POA: Insufficient documentation

## 2019-10-09 DIAGNOSIS — R7303 Prediabetes: Secondary | ICD-10-CM | POA: Insufficient documentation

## 2019-10-09 LAB — COMPREHENSIVE METABOLIC PANEL
ALT: 10 IU/L (ref 0–32)
AST: 13 IU/L (ref 0–40)
Albumin/Globulin Ratio: 1.9 (ref 1.2–2.2)
Albumin: 4.3 g/dL (ref 3.8–4.9)
Alkaline Phosphatase: 94 IU/L (ref 39–117)
BUN/Creatinine Ratio: 16 (ref 9–23)
BUN: 12 mg/dL (ref 6–24)
Bilirubin Total: 0.2 mg/dL (ref 0.0–1.2)
CO2: 26 mmol/L (ref 20–29)
Calcium: 9.4 mg/dL (ref 8.7–10.2)
Chloride: 102 mmol/L (ref 96–106)
Creatinine, Ser: 0.75 mg/dL (ref 0.57–1.00)
GFR calc Af Amer: 103 mL/min/{1.73_m2} (ref >59)
GFR calc non Af Amer: 89 mL/min/{1.73_m2} (ref >59)
Globulin, Total: 2.3 g/dL (ref 1.5–4.5)
Glucose: 99 mg/dL (ref 65–99)
Potassium: 4.4 mmol/L (ref 3.5–5.2)
Sodium: 143 mmol/L (ref 134–144)
Total Protein: 6.6 g/dL (ref 6.0–8.5)

## 2019-10-09 LAB — LIPID PANEL WITH LDL/HDL RATIO
Cholesterol, Total: 320 mg/dL — ABNORMAL HIGH (ref 100–199)
HDL: 76 mg/dL (ref >39)
LDL Chol Calc (NIH): 186 mg/dL — ABNORMAL HIGH (ref 0–99)
LDL/HDL Ratio: 2.4 ratio (ref 0.0–3.2)
Triglycerides: 299 mg/dL — ABNORMAL HIGH (ref 0–149)
VLDL Cholesterol Cal: 58 mg/dL — ABNORMAL HIGH (ref 5–40)

## 2019-10-09 LAB — VITAMIN D 25 HYDROXY (VIT D DEFICIENCY, FRACTURES): Vit D, 25-Hydroxy: 26.5 ng/mL — ABNORMAL LOW (ref 30.0–100.0)

## 2019-10-09 LAB — T4, FREE: Free T4: 1.08 ng/dL (ref 0.82–1.77)

## 2019-10-09 LAB — T3: T3, Total: 136 ng/dL (ref 71–180)

## 2019-10-09 LAB — INSULIN, RANDOM: INSULIN: 9.6 u[IU]/mL (ref 2.6–24.9)

## 2019-10-09 LAB — HEMOGLOBIN A1C
Est. average glucose Bld gHb Est-mCnc: 120 mg/dL
Hgb A1c MFr Bld: 5.8 % — ABNORMAL HIGH (ref 4.8–5.6)

## 2019-10-09 LAB — TSH: TSH: 1.62 u[IU]/mL (ref 0.450–4.500)

## 2019-10-22 ENCOUNTER — Other Ambulatory Visit: Payer: Self-pay

## 2019-10-22 ENCOUNTER — Encounter (INDEPENDENT_AMBULATORY_CARE_PROVIDER_SITE_OTHER): Payer: Self-pay | Admitting: Bariatrics

## 2019-10-22 ENCOUNTER — Ambulatory Visit (INDEPENDENT_AMBULATORY_CARE_PROVIDER_SITE_OTHER): Payer: BLUE CROSS/BLUE SHIELD | Admitting: Bariatrics

## 2019-10-22 VITALS — BP 118/63 | HR 78 | Temp 98.1°F | Ht 69.0 in | Wt 202.0 lb

## 2019-10-22 DIAGNOSIS — E6609 Other obesity due to excess calories: Secondary | ICD-10-CM

## 2019-10-22 DIAGNOSIS — Z9189 Other specified personal risk factors, not elsewhere classified: Secondary | ICD-10-CM

## 2019-10-22 DIAGNOSIS — E782 Mixed hyperlipidemia: Secondary | ICD-10-CM

## 2019-10-22 DIAGNOSIS — R7303 Prediabetes: Secondary | ICD-10-CM | POA: Diagnosis not present

## 2019-10-22 DIAGNOSIS — Z6830 Body mass index (BMI) 30.0-30.9, adult: Secondary | ICD-10-CM

## 2019-10-22 DIAGNOSIS — E559 Vitamin D deficiency, unspecified: Secondary | ICD-10-CM | POA: Diagnosis not present

## 2019-10-22 DIAGNOSIS — E669 Obesity, unspecified: Secondary | ICD-10-CM | POA: Insufficient documentation

## 2019-10-22 MED ORDER — VITAMIN D3 1.25 MG (50000 UT) PO TABS: 1.0000 | ORAL_TABLET | ORAL | 0 refills | Status: DC

## 2019-10-22 NOTE — Progress Notes (Signed)
Chief Complaint:   OBESITY Megan Potts is here to discuss her progress with her obesity treatment plan along with follow-up of her obesity related diagnoses. Megan Potts is on the Category 3 Plan and states she is following her eating plan approximately 90% of the time. Megan Potts states she is exercising 0 minutes 0 times per week.  Today's visit was #: 2 Starting weight: 205 lbs Starting date: 10/08/2019 Today's weight: 202 lbs Today's date: 10/22/2019 Total lbs lost to date: 3 Total lbs lost since last in-office visit: 3  Interim History: Megan Potts is down 3 lbs. She did attend some celebrations. She thinks that the breakfast gets boring. She likes the frozen meals for lunch.   Subjective:   Vitamin D deficiency. Megan Potts is taking Vitamin D2. Last Vitamin D 26.5 on 10/08/2019.  Prediabetes. Megan Potts has a diagnosis of prediabetes based on her elevated HgA1c and was informed this puts her at greater risk of developing diabetes. She continues to work on diet and exercise to decrease her risk of diabetes. She denies nausea or hypoglycemia. No polyphagia.  Lab Results  Component Value Date   HGBA1C 5.8 (H) 10/08/2019   Lab Results  Component Value Date   INSULIN 9.6 10/08/2019   Mixed hyperlipidemia. Megan Potts is on no medications.  Lab Results  Component Value Date   CHOL 320 (H) 10/08/2019   HDL 76 10/08/2019   LDLCALC 186 (H) 10/08/2019   TRIG 299 (H) 10/08/2019   CHOLHDL 3.7 09/15/2014   Lab Results  Component Value Date   ALT 10 10/08/2019   AST 13 10/08/2019   ALKPHOS 94 10/08/2019   BILITOT 0.2 10/08/2019   The 10-year ASCVD risk score Megan Bussing DC Jr., et al., 2013) is: 2.2%   Values used to calculate the score:     Age: 54 years     Sex: Female     Is Non-Hispanic African American: No     Diabetic: No     Tobacco smoker: No     Systolic Blood Pressure: 532 mmHg     Is BP treated: No     HDL Cholesterol: 76 mg/dL     Total Cholesterol: 320 mg/dL  At risk for  diabetes mellitus. Megan Potts is at higher than average risk for developing diabetes due to her obesity.   Assessment/Plan:   Vitamin D deficiency. Low Vitamin D level contributes to fatigue and are associated with obesity, breast, and colon cancer. She was given a prescription for Vitamin D3 50,000 IU every week #4 with 0 refills (changed from D2) and will follow-up for routine testing of Vitamin D, at least 2-3 times per year to avoid over-replacement.  Prediabetes. Megan Potts will continue to work on weight loss, exercise, increasing protein and healthy fats, and decreasing simple carbohydrates to help decrease the risk of diabetes.   Mixed hyperlipidemia. Cardiovascular risk and specific lipid/LDL goals reviewed.  We discussed several lifestyle modifications today and Megan Potts will continue to work on diet, exercise and weight loss efforts. Orders and follow up as documented in patient record. Megan Potts declines statin. Will work on diet and exercise.  Counseling Intensive lifestyle modifications are the first line treatment for this issue. . Dietary changes: Increase soluble fiber. Decrease simple carbohydrates. . Exercise changes: Moderate to vigorous-intensity aerobic activity 150 minutes per week if tolerated. . Lipid-lowering medications: see documented in medical record.  At risk for diabetes mellitus. Megan Potts was given approximately 15 minutes of diabetes education and counseling today. We discussed intensive lifestyle  modifications today with an emphasis on weight loss as well as increasing exercise and decreasing simple carbohydrates in her diet. We also reviewed medication options with an emphasis on risk versus benefit of those discussed.   Repetitive spaced learning was employed today to elicit superior memory formation and behavioral change.  Class 1 obesity with serious comorbidity and body mass index (BMI) of 30.0 to 30.9 in adult, unspecified obesity type - BMI greater than 30 at start of  program.  Megan Potts is currently in the action stage of change. As such, her goal is to continue with weight loss efforts. She has agreed to the Category 3 Plan with additional lunch and dinner options.   We independently reviewed with the patient lab results from 10/08/2019 including CMP, lipids, Vitamin D, A1c, insulin, and thyroid panel.  She will work on meal planning.  Exercise goals: All adults should avoid inactivity. Some physical activity is better than none, and adults who participate in any amount of physical activity gain some health benefits.  Behavioral modification strategies: increasing lean protein intake, decreasing simple carbohydrates, increasing vegetables, increasing water intake, decreasing eating out, no skipping meals, meal planning and cooking strategies, keeping healthy foods in the home and planning for success.  Megan Potts has agreed to follow-up with our clinic in 2 weeks. She was informed of the importance of frequent follow-up visits to maximize her success with intensive lifestyle modifications for her multiple health conditions.   Objective:   Blood pressure 118/63, pulse 78, temperature 98.1 F (36.7 C), temperature source Oral, height 5\' 9"  (1.753 m), weight 202 lb (91.6 kg), SpO2 98 %. Body mass index is 29.83 kg/m.  General: Cooperative, alert, well developed, in no acute distress. HEENT: Conjunctivae and lids unremarkable. Cardiovascular: Regular rhythm.  Lungs: Normal work of breathing. Neurologic: No focal deficits.   Lab Results  Component Value Date   CREATININE 0.75 10/08/2019   BUN 12 10/08/2019   NA 143 10/08/2019   K 4.4 10/08/2019   CL 102 10/08/2019   CO2 26 10/08/2019   Lab Results  Component Value Date   ALT 10 10/08/2019   AST 13 10/08/2019   ALKPHOS 94 10/08/2019   BILITOT 0.2 10/08/2019   Lab Results  Component Value Date   HGBA1C 5.8 (H) 10/08/2019   Lab Results  Component Value Date   INSULIN 9.6 10/08/2019   Lab  Results  Component Value Date   TSH 1.620 10/08/2019   Lab Results  Component Value Date   CHOL 320 (H) 10/08/2019   HDL 76 10/08/2019   LDLCALC 186 (H) 10/08/2019   TRIG 299 (H) 10/08/2019   CHOLHDL 3.7 09/15/2014   Lab Results  Component Value Date   WBC 5.1 09/15/2014   HGB 12.3 09/15/2014   HCT 37.0 09/15/2014   MCV 88.3 09/15/2014   PLT 320 09/15/2014   No results found for: IRON, TIBC, FERRITIN  Attestation Statements:   Reviewed by clinician on day of visit: allergies, medications, problem list, medical history, surgical history, family history, social history, and previous encounter notes.  09/17/2014, am acting as Fernanda Drum for Energy manager, DO   I have reviewed the above documentation for accuracy and completeness, and I agree with the above. [employer], DO

## 2019-11-11 ENCOUNTER — Other Ambulatory Visit: Payer: Self-pay

## 2019-11-11 ENCOUNTER — Ambulatory Visit (INDEPENDENT_AMBULATORY_CARE_PROVIDER_SITE_OTHER): Payer: BLUE CROSS/BLUE SHIELD | Admitting: Bariatrics

## 2019-11-11 ENCOUNTER — Encounter (INDEPENDENT_AMBULATORY_CARE_PROVIDER_SITE_OTHER): Payer: Self-pay | Admitting: Bariatrics

## 2019-11-11 VITALS — BP 122/77 | HR 68 | Temp 98.2°F | Ht 69.0 in | Wt 201.0 lb

## 2019-11-11 DIAGNOSIS — E6609 Other obesity due to excess calories: Secondary | ICD-10-CM | POA: Diagnosis not present

## 2019-11-11 DIAGNOSIS — E782 Mixed hyperlipidemia: Secondary | ICD-10-CM | POA: Diagnosis not present

## 2019-11-11 DIAGNOSIS — Z6830 Body mass index (BMI) 30.0-30.9, adult: Secondary | ICD-10-CM

## 2019-11-11 DIAGNOSIS — E559 Vitamin D deficiency, unspecified: Secondary | ICD-10-CM | POA: Diagnosis not present

## 2019-11-11 NOTE — Progress Notes (Signed)
Chief Complaint:   OBESITY Megan Potts is here to discuss her progress with her obesity treatment plan along with follow-up of her obesity related diagnoses. Megan Potts is on the Category 3 Plan with additional lunch options and states she is following her eating plan approximately 90% of the time. Megan Potts states she is exercising 0 minutes 0 times per week.  Today's visit was #: 3 Starting weight: 205 lbs Starting date: 10/08/2019 Today's weight: 201 lbs Today's date: 11/11/2019 Total lbs lost to date: 4 Total lbs lost since last in-office visit: 1  Interim History: Megan Potts is down 1 lb. She has struggled with exercise.  Subjective:   Vitamin D deficiency.  Megan Potts is taking Vitamin D. Last Vitamin D 26.5 on 10/08/2019.  Mixed hyperlipidemia. Megan Potts is on no medications.  Lab Results  Component Value Date   CHOL 320 (H) 10/08/2019   HDL 76 10/08/2019   LDLCALC 186 (H) 10/08/2019   TRIG 299 (H) 10/08/2019   CHOLHDL 3.7 09/15/2014   Lab Results  Component Value Date   ALT 10 10/08/2019   AST 13 10/08/2019   ALKPHOS 94 10/08/2019   BILITOT 0.2 10/08/2019   The 10-year ASCVD risk score Megan Bussing DC Jr., et al., 2013) is: 2.4%   Values used to calculate the score:     Age: 54 years     Sex: Female     Is Non-Hispanic African American: No     Diabetic: No     Tobacco smoker: No     Systolic Blood Pressure: 419 mmHg     Is BP treated: No     HDL Cholesterol: 76 mg/dL     Total Cholesterol: 320 mg/dL  Assessment/Plan:   Vitamin D deficiency. Low Vitamin D level contributes to fatigue and are associated with obesity, breast, and colon cancer. She agrees to continue to take Vitamin D and will follow-up for routine testing of Vitamin D, at least 2-3 times per year to avoid over-replacement.  Mixed hyperlipidemia. Cardiovascular risk and specific lipid/LDL goals reviewed.  We discussed several lifestyle modifications today and Megan Potts will continue to work on diet, exercise and  weight loss efforts. Orders and follow up as documented in patient record. Megan Potts will decrease saturated fats, avoid trans fats, and increase PUFA's and MUFA's.  Counseling Intensive lifestyle modifications are the first line treatment for this issue. . Dietary changes: Increase soluble fiber. Decrease simple carbohydrates. . Exercise changes: Moderate to vigorous-intensity aerobic activity 150 minutes per week if tolerated. . Lipid-lowering medications: see documented in medical record.  Class 1 obesity due to excess calories without serious comorbidity with body mass index (BMI) of 30.0 to 30.9 in adult - BMI Greater than 30 at start of program.   Megan Potts is currently in the action stage of change. As such, her goal is to continue with weight loss efforts. She has agreed to the Category 3 Plan with additional lunch options.   She will work on meal planning, increasing her water intake, and will use Fit Bit.  Exercise goals: Megan Potts will walk 20 minutes 2 times per week.  Behavioral modification strategies: increasing lean protein intake, decreasing simple carbohydrates, increasing vegetables, increasing water intake, decreasing eating out, no skipping meals, meal planning and cooking strategies, keeping healthy foods in the home and planning for success.  Megan Potts has agreed to follow-up with our clinic in 2 weeks. She was informed of the importance of frequent follow-up visits to maximize her success with intensive lifestyle modifications  for her multiple health conditions.   Objective:   Blood pressure 122/77, pulse 68, temperature 98.2 F (36.8 C), height 5\' 9"  (1.753 m), weight 201 lb (91.2 kg), SpO2 96 %. Body mass index is 29.68 kg/m.  General: Cooperative, alert, well developed, in no acute distress. HEENT: Conjunctivae and lids unremarkable. Cardiovascular: Regular rhythm.  Lungs: Normal work of breathing. Neurologic: No focal deficits.   Lab Results  Component Value Date    CREATININE 0.75 10/08/2019   BUN 12 10/08/2019   NA 143 10/08/2019   K 4.4 10/08/2019   CL 102 10/08/2019   CO2 26 10/08/2019   Lab Results  Component Value Date   ALT 10 10/08/2019   AST 13 10/08/2019   ALKPHOS 94 10/08/2019   BILITOT 0.2 10/08/2019   Lab Results  Component Value Date   HGBA1C 5.8 (H) 10/08/2019   Lab Results  Component Value Date   INSULIN 9.6 10/08/2019   Lab Results  Component Value Date   TSH 1.620 10/08/2019   Lab Results  Component Value Date   CHOL 320 (H) 10/08/2019   HDL 76 10/08/2019   LDLCALC 186 (H) 10/08/2019   TRIG 299 (H) 10/08/2019   CHOLHDL 3.7 09/15/2014   Lab Results  Component Value Date   WBC 5.1 09/15/2014   HGB 12.3 09/15/2014   HCT 37.0 09/15/2014   MCV 88.3 09/15/2014   PLT 320 09/15/2014   No results found for: IRON, TIBC, FERRITIN  Attestation Statements:   Reviewed by clinician on day of visit: allergies, medications, problem list, medical history, surgical history, family history, social history, and previous encounter notes.  Time spent on visit including pre-visit chart review and post-visit charting and care was 20 minutes.   09/17/2014, am acting as Fernanda Drum for Energy manager, DO   I have reviewed the above documentation for accuracy and completeness, and I agree with the above. [employer], DO

## 2019-11-27 ENCOUNTER — Encounter (INDEPENDENT_AMBULATORY_CARE_PROVIDER_SITE_OTHER): Payer: Self-pay | Admitting: Bariatrics

## 2019-11-27 ENCOUNTER — Ambulatory Visit (INDEPENDENT_AMBULATORY_CARE_PROVIDER_SITE_OTHER): Payer: BLUE CROSS/BLUE SHIELD | Admitting: Bariatrics

## 2019-11-27 ENCOUNTER — Other Ambulatory Visit: Payer: Self-pay

## 2019-11-27 VITALS — BP 122/80 | HR 78 | Temp 98.5°F | Ht 69.0 in | Wt 197.0 lb

## 2019-11-27 DIAGNOSIS — Z6830 Body mass index (BMI) 30.0-30.9, adult: Secondary | ICD-10-CM | POA: Diagnosis not present

## 2019-11-27 DIAGNOSIS — R7303 Prediabetes: Secondary | ICD-10-CM | POA: Diagnosis not present

## 2019-11-27 DIAGNOSIS — E038 Other specified hypothyroidism: Secondary | ICD-10-CM | POA: Diagnosis not present

## 2019-11-27 DIAGNOSIS — E669 Obesity, unspecified: Secondary | ICD-10-CM

## 2019-11-27 NOTE — Progress Notes (Signed)
Chief Complaint:   OBESITY Megan Potts is here to discuss her progress with her obesity treatment plan along with follow-up of her obesity related diagnoses. Megan Potts is on the Category 3 Plan and states she is following her eating plan approximately 90% of the time. Megan Potts states she is stretching/cardio 15 minutes 5 times per week.  Today's visit was #: 4 Starting weight: 205 lbs Starting date: 10/08/2019 Today's weight: 197 lbs Today's date: 11/27/2019 Total lbs lost to date: 8 Total lbs lost since last in-office visit: 4  Interim History: Megan Potts is down 4 lbs and doing well overall. Her weight is below 200 and she is doing well with her water and protein intake.  Subjective:   Prediabetes. Megan Potts has a diagnosis of prediabetes based on her elevated HgA1c and was informed this puts her at greater risk of developing diabetes. She continues to work on diet and exercise to decrease her risk of diabetes. She denies nausea or hypoglycemia. No polyphagia.  Lab Results  Component Value Date   HGBA1C 5.8 (H) 10/08/2019   Lab Results  Component Value Date   INSULIN 9.6 10/08/2019   Other specified hypothyroidism. Megan Potts is taking Synthroid.   Lab Results  Component Value Date   TSH 1.620 10/08/2019   Assessment/Plan:   Prediabetes. Megan Potts will continue to work on weight loss, increasing activity, increasing healthy fats and protein, and decreasing simple carbohydrates to help decrease the risk of diabetes.   Other specified hypothyroidism. Patient with long-standing hypothyroidism, on levothyroxine therapy. She appears euthyroid. Orders and follow up as documented in patient record. Megan Potts will continue Synthroid as directed.  Counseling . Good thyroid control is important for overall health. Supratherapeutic thyroid levels are dangerous and will not improve weight loss results. . The correct way to take levothyroxine is fasting, with water, separated by at least 30 minutes  from breakfast, and separated by more than 4 hours from calcium, iron, multivitamins, acid reflux medications (PPIs).   Class 1 obesity with serious comorbidity and body mass index (BMI) of 30.0 to 30.9 in adult, unspecified obesity type - BMI greater than 30 at start of program.   Megan Potts is currently in the action stage of change. As such, her goal is to continue with weight loss efforts. She has agreed to the Category 3 Plan with additional lunch options.  She will work on meal planning and intentional eating. Other breakfast options were discussed.   Exercise goals: Megan Potts will get a class online.  Behavioral modification strategies: increasing lean protein intake, decreasing simple carbohydrates, increasing vegetables, increasing water intake, decreasing eating out, no skipping meals, meal planning and cooking strategies, keeping healthy foods in the home and planning for success.  Megan Potts has agreed to follow-up with our clinic in 2 weeks. She was informed of the importance of frequent follow-up visits to maximize her success with intensive lifestyle modifications for her multiple health conditions.   Objective:   Blood pressure 122/80, pulse 78, temperature 98.5 F (36.9 C), height 5\' 9"  (1.753 m), weight 197 lb (89.4 kg), SpO2 96 %. Body mass index is 29.09 kg/m.  General: Cooperative, alert, well developed, in no acute distress. HEENT: Conjunctivae and lids unremarkable. Cardiovascular: Regular rhythm.  Lungs: Normal work of breathing. Neurologic: No focal deficits.   Lab Results  Component Value Date   CREATININE 0.75 10/08/2019   BUN 12 10/08/2019   NA 143 10/08/2019   K 4.4 10/08/2019   CL 102 10/08/2019   CO2  26 10/08/2019   Lab Results  Component Value Date   ALT 10 10/08/2019   AST 13 10/08/2019   ALKPHOS 94 10/08/2019   BILITOT 0.2 10/08/2019   Lab Results  Component Value Date   HGBA1C 5.8 (H) 10/08/2019   Lab Results  Component Value Date   INSULIN 9.6  10/08/2019   Lab Results  Component Value Date   TSH 1.620 10/08/2019   Lab Results  Component Value Date   CHOL 320 (H) 10/08/2019   HDL 76 10/08/2019   LDLCALC 186 (H) 10/08/2019   TRIG 299 (H) 10/08/2019   CHOLHDL 3.7 09/15/2014   Lab Results  Component Value Date   WBC 5.1 09/15/2014   HGB 12.3 09/15/2014   HCT 37.0 09/15/2014   MCV 88.3 09/15/2014   PLT 320 09/15/2014   No results found for: IRON, TIBC, FERRITIN  Attestation Statements:   Reviewed by clinician on day of visit: allergies, medications, problem list, medical history, surgical history, family history, social history, and previous encounter notes.  Time spent on visit including pre-visit chart review and post-visit charting and care was 20 minutes.   Fernanda Drum, am acting as Energy manager for [employer], DO   I have reviewed the above documentation for accuracy and completeness, and I agree with the above. Corinna Capra, DO

## 2019-12-18 ENCOUNTER — Encounter (INDEPENDENT_AMBULATORY_CARE_PROVIDER_SITE_OTHER): Payer: Self-pay | Admitting: Bariatrics

## 2019-12-18 ENCOUNTER — Other Ambulatory Visit: Payer: Self-pay

## 2019-12-18 ENCOUNTER — Ambulatory Visit (INDEPENDENT_AMBULATORY_CARE_PROVIDER_SITE_OTHER): Payer: BLUE CROSS/BLUE SHIELD | Admitting: Bariatrics

## 2019-12-18 VITALS — BP 134/79 | HR 75 | Temp 98.5°F | Ht 69.0 in | Wt 197.0 lb

## 2019-12-18 DIAGNOSIS — E559 Vitamin D deficiency, unspecified: Secondary | ICD-10-CM

## 2019-12-18 DIAGNOSIS — Z6830 Body mass index (BMI) 30.0-30.9, adult: Secondary | ICD-10-CM

## 2019-12-18 DIAGNOSIS — E669 Obesity, unspecified: Secondary | ICD-10-CM

## 2019-12-18 DIAGNOSIS — E038 Other specified hypothyroidism: Secondary | ICD-10-CM

## 2019-12-18 NOTE — Progress Notes (Signed)
Chief Complaint:   OBESITY Megan Potts is here to discuss her progress with her obesity treatment plan along with follow-up of her obesity related diagnoses. Megan Potts is on the Category 3 Plan and states she is following her eating plan approximately 75-80% of the time. Megan Potts states she is doing yard work 20 minutes 5 times per week.  Today's visit was #: 5 Starting weight: 205 lbs Starting date: 10/08/2019 Today's weight: 197 lbs Today's date: 12/18/2019 Total lbs lost to date: 8 Total lbs lost since last in-office visit: 0  Interim History: Megan Potts's weight remains the same.  Subjective:   Vitamin D deficiency. No nausea, vomiting, or muscle weakness. Last Vitamin D 26.5 on 10/08/2019.  Other specified hypothyroidism. Megan Potts is taking Synthroid.   Lab Results  Component Value Date   TSH 1.620 10/08/2019   Assessment/Plan:   Vitamin D deficiency. Low Vitamin D level contributes to fatigue and are associated with obesity, breast, and colon cancer. She agrees to continue to take prescription Vitamin D @50 ,000 IU every week #4 with 0 refills and will follow-up for routine testing of Vitamin D, at least 2-3 times per year to avoid over-replacement.  Other specified hypothyroidism. Patient with long-standing hypothyroidism, on levothyroxine therapy. She appears euthyroid. Orders and follow up as documented in patient record. Megan Potts will continue Synthroid as directed.  Counseling . Good thyroid control is important for overall health. Supratherapeutic thyroid levels are dangerous and will not improve weight loss results. . The correct way to take levothyroxine is fasting, with water, separated by at least 30 minutes from breakfast, and separated by more than 4 hours from calcium, iron, multivitamins, acid reflux medications (PPIs).   Class 1 obesity with serious comorbidity and body mass index (BMI) of 30.0 to 30.9 in adult, unspecified obesity type.  Megan Potts is currently in  the action stage of change. As such, her goal is to continue with weight loss efforts. She has agreed to the Category 3 Plan.   She will work on meal planning an will pack her lunch.  Exercise goals: All adults should avoid inactivity. Some physical activity is better than none, and adults who participate in any amount of physical activity gain some health benefits.  Behavioral modification strategies: increasing lean protein intake, decreasing simple carbohydrates, increasing vegetables, increasing water intake, decreasing eating out, no skipping meals, meal planning and cooking strategies and keeping healthy foods in the home.  Megan Potts has agreed to follow-up with our clinic in 2 weeks. She was informed of the importance of frequent follow-up visits to maximize her success with intensive lifestyle modifications for her multiple health conditions.   Objective:   Blood pressure 134/79, pulse 75, temperature 98.5 F (36.9 C), height 5\' 9"  (1.753 m), weight 197 lb (89.4 kg), SpO2 98 %. Body mass index is 29.09 kg/m.  General: Cooperative, alert, well developed, in no acute distress. HEENT: Conjunctivae and lids unremarkable. Cardiovascular: Regular rhythm.  Lungs: Normal work of breathing. Neurologic: No focal deficits.   Lab Results  Component Value Date   CREATININE 0.75 10/08/2019   BUN 12 10/08/2019   NA 143 10/08/2019   K 4.4 10/08/2019   CL 102 10/08/2019   CO2 26 10/08/2019   Lab Results  Component Value Date   ALT 10 10/08/2019   AST 13 10/08/2019   ALKPHOS 94 10/08/2019   BILITOT 0.2 10/08/2019   Lab Results  Component Value Date   HGBA1C 5.8 (H) 10/08/2019   Lab Results  Component Value Date   INSULIN 9.6 10/08/2019   Lab Results  Component Value Date   TSH 1.620 10/08/2019   Lab Results  Component Value Date   CHOL 320 (H) 10/08/2019   HDL 76 10/08/2019   LDLCALC 186 (H) 10/08/2019   TRIG 299 (H) 10/08/2019   CHOLHDL 3.7 09/15/2014   Lab Results    Component Value Date   WBC 5.1 09/15/2014   HGB 12.3 09/15/2014   HCT 37.0 09/15/2014   MCV 88.3 09/15/2014   PLT 320 09/15/2014   No results found for: IRON, TIBC, FERRITIN  Attestation Statements:   Reviewed by clinician on day of visit: allergies, medications, problem list, medical history, surgical history, family history, social history, and previous encounter notes.  Migdalia Dk, am acting as Location manager for [employer], DO   I have reviewed the above documentation for accuracy and completeness, and I agree with the above. Jearld Lesch, DO

## 2019-12-22 ENCOUNTER — Encounter (INDEPENDENT_AMBULATORY_CARE_PROVIDER_SITE_OTHER): Payer: Self-pay | Admitting: Bariatrics

## 2020-01-06 ENCOUNTER — Ambulatory Visit (INDEPENDENT_AMBULATORY_CARE_PROVIDER_SITE_OTHER): Payer: BLUE CROSS/BLUE SHIELD | Admitting: Family Medicine

## 2020-01-07 ENCOUNTER — Ambulatory Visit (INDEPENDENT_AMBULATORY_CARE_PROVIDER_SITE_OTHER): Payer: BLUE CROSS/BLUE SHIELD | Admitting: Family Medicine

## 2020-01-14 ENCOUNTER — Ambulatory Visit (INDEPENDENT_AMBULATORY_CARE_PROVIDER_SITE_OTHER): Payer: BLUE CROSS/BLUE SHIELD | Admitting: Family Medicine

## 2020-01-14 ENCOUNTER — Other Ambulatory Visit: Payer: Self-pay

## 2020-01-14 ENCOUNTER — Encounter (INDEPENDENT_AMBULATORY_CARE_PROVIDER_SITE_OTHER): Payer: Self-pay | Admitting: Family Medicine

## 2020-01-14 VITALS — BP 123/81 | HR 70 | Temp 98.0°F | Ht 69.0 in | Wt 198.0 lb

## 2020-01-14 DIAGNOSIS — R7303 Prediabetes: Secondary | ICD-10-CM

## 2020-01-14 DIAGNOSIS — E669 Obesity, unspecified: Secondary | ICD-10-CM

## 2020-01-14 DIAGNOSIS — E559 Vitamin D deficiency, unspecified: Secondary | ICD-10-CM | POA: Diagnosis not present

## 2020-01-14 DIAGNOSIS — Z9189 Other specified personal risk factors, not elsewhere classified: Secondary | ICD-10-CM

## 2020-01-14 DIAGNOSIS — Z6830 Body mass index (BMI) 30.0-30.9, adult: Secondary | ICD-10-CM

## 2020-01-14 MED ORDER — OZEMPIC (0.25 OR 0.5 MG/DOSE) 2 MG/1.5ML ~~LOC~~ SOPN: 0.2500 mg | PEN_INJECTOR | SUBCUTANEOUS | 0 refills | Status: DC

## 2020-01-14 NOTE — Progress Notes (Signed)
Chief Complaint:   OBESITY Megan Potts is here to discuss her progress with her obesity treatment plan along with follow-up of her obesity related diagnoses. Megan Potts is on the Category 3 Plan and states she is following her eating plan approximately 30% of the time. Megan Potts states she is doing home workouts for 20 minutes 1-2 times per week.  Today's visit was #: 6 Starting weight: 205 lbs Starting date: 10/08/2019 Today's weight: 198 lbs Today's date: 01/14/2020 Total lbs lost to date: 7 Total lbs lost since last in-office visit: 0  Interim History: Megan Potts has been off the plan due to birthdays and Mother's Day.She feels she needs medication to help with appetite.  She notes getting the protein in on the plan.  Her husband has early dementia which increases her anxiety. Her husband forgets that she is on the plan and sabotages her unintentionally. Subjective:   1. Pre-diabetes Megan Potts has a diagnosis of prediabetes based on her elevated Hgb A1c and was informed this puts her at greater risk of developing diabetes. She notes polyphagia and cravings, and she is not on metformin. She continues to work on diet and exercise to decrease her risk of diabetes. She denies nausea or hypoglycemia.  Lab Results  Component Value Date   HGBA1C 5.8 (H) 10/08/2019   Lab Results  Component Value Date   INSULIN 9.6 10/08/2019   2. Vitamin D deficiency Megan Potts's last Vit D level was low at 26.5. She is on prescription Vit D.  3. At risk for diabetes mellitus Megan Potts is at higher than average risk for developing diabetes due to her obesity.   Assessment/Plan:   1. Pre-diabetes Megan Potts will continue to work on weight loss, exercise, and decreasing simple carbohydrates to help decrease the risk of diabetes. Megan Potts agreed to start Ozempic 0.25 mg SubQ weekly with no refills.  - Semaglutide,0.25 or 0.5MG /DOS, (OZEMPIC, 0.25 OR 0.5 MG/DOSE,) 2 MG/1.5ML SOPN; Inject 0.25 mg into the skin once a week.   Dispense: 1 pen; Refill: 0  2. Vitamin D deficiency Low Vitamin D level contributes to fatigue and are associated with obesity, breast, and colon cancer. Megan Potts agreed to continue taking prescription Vitamin D and will follow-up for routine testing of Vitamin D, at least 2-3 times per year to avoid over-replacement.  3. At risk for diabetes mellitus Megan Potts was given approximately 15 minutes of diabetes education and counseling today. We discussed intensive lifestyle modifications today with an emphasis on weight loss as well as increasing exercise and decreasing simple carbohydrates in her diet. We also reviewed medication options with an emphasis on risk versus benefit of those discussed.   Repetitive spaced learning was employed today to elicit superior memory formation and behavioral change.  4. Class 1 obesity with serious comorbidity and body mass index (BMI) of 30.0 to 30.9 in adult, unspecified obesity type Megan Potts is currently in the action stage of change. As such, her goal is to continue with weight loss efforts. She has agreed to the Category 3 Plan and keeping a food journal and adhering to recommended goals of 450-600 calories and 40 grams of protein at supper daily.   Handouts given today: Journaling and Eating Out.  Exercise goals: As is.  Behavioral modification strategies: increasing lean protein intake, decreasing simple carbohydrates and dealing with family or coworker sabotage.  Megan Potts has agreed to follow-up with our clinic in 3 weeks. She was informed of the importance of frequent follow-up visits to maximize her success with intensive lifestyle  modifications for her multiple health conditions.   Objective:   Blood pressure 123/81, pulse 70, temperature 98 F (36.7 C), temperature source Oral, height 5\' 9"  (1.753 m), weight 198 lb (89.8 kg), SpO2 97 %. Body mass index is 29.24 kg/m.  General: Cooperative, alert, well developed, in no acute distress. HEENT:  Conjunctivae and lids unremarkable. Cardiovascular: Regular rhythm.  Lungs: Normal work of breathing. Neurologic: No focal deficits.   Lab Results  Component Value Date   CREATININE 0.75 10/08/2019   BUN 12 10/08/2019   NA 143 10/08/2019   K 4.4 10/08/2019   CL 102 10/08/2019   CO2 26 10/08/2019   Lab Results  Component Value Date   ALT 10 10/08/2019   AST 13 10/08/2019   ALKPHOS 94 10/08/2019   BILITOT 0.2 10/08/2019   Lab Results  Component Value Date   HGBA1C 5.8 (H) 10/08/2019   Lab Results  Component Value Date   INSULIN 9.6 10/08/2019   Lab Results  Component Value Date   TSH 1.620 10/08/2019   Lab Results  Component Value Date   CHOL 320 (H) 10/08/2019   HDL 76 10/08/2019   LDLCALC 186 (H) 10/08/2019   TRIG 299 (H) 10/08/2019   CHOLHDL 3.7 09/15/2014   Lab Results  Component Value Date   WBC 5.1 09/15/2014   HGB 12.3 09/15/2014   HCT 37.0 09/15/2014   MCV 88.3 09/15/2014   PLT 320 09/15/2014   No results found for: IRON, TIBC, FERRITIN  Attestation Statements:   Reviewed by clinician on day of visit: allergies, medications, problem list, medical history, surgical history, family history, social history, and previous encounter notes.   09/17/2014, am acting as Trude Mcburney for Energy manager, FNP-C.  I have reviewed the above documentation for accuracy and completeness, and I agree with the above. -  [place], FNP

## 2020-01-20 ENCOUNTER — Encounter (INDEPENDENT_AMBULATORY_CARE_PROVIDER_SITE_OTHER): Payer: Self-pay | Admitting: Family Medicine

## 2020-01-22 ENCOUNTER — Encounter (INDEPENDENT_AMBULATORY_CARE_PROVIDER_SITE_OTHER): Payer: Self-pay

## 2020-02-10 ENCOUNTER — Other Ambulatory Visit: Payer: Self-pay

## 2020-02-10 ENCOUNTER — Ambulatory Visit (INDEPENDENT_AMBULATORY_CARE_PROVIDER_SITE_OTHER): Payer: BLUE CROSS/BLUE SHIELD | Admitting: Family Medicine

## 2020-02-10 ENCOUNTER — Encounter (INDEPENDENT_AMBULATORY_CARE_PROVIDER_SITE_OTHER): Payer: Self-pay | Admitting: Family Medicine

## 2020-02-10 VITALS — BP 129/77 | HR 71 | Temp 97.7°F | Ht 69.0 in | Wt 198.0 lb

## 2020-02-10 DIAGNOSIS — E559 Vitamin D deficiency, unspecified: Secondary | ICD-10-CM

## 2020-02-10 DIAGNOSIS — E038 Other specified hypothyroidism: Secondary | ICD-10-CM | POA: Diagnosis not present

## 2020-02-10 DIAGNOSIS — Z9189 Other specified personal risk factors, not elsewhere classified: Secondary | ICD-10-CM | POA: Diagnosis not present

## 2020-02-10 DIAGNOSIS — R7303 Prediabetes: Secondary | ICD-10-CM | POA: Diagnosis not present

## 2020-02-10 DIAGNOSIS — E669 Obesity, unspecified: Secondary | ICD-10-CM

## 2020-02-10 DIAGNOSIS — E7849 Other hyperlipidemia: Secondary | ICD-10-CM | POA: Diagnosis not present

## 2020-02-10 DIAGNOSIS — Z6830 Body mass index (BMI) 30.0-30.9, adult: Secondary | ICD-10-CM

## 2020-02-10 MED ORDER — CONTRAVE 8-90 MG PO TB12: 2.0000 | ORAL_TABLET | Freq: Two times a day (BID) | ORAL | 0 refills | Status: AC

## 2020-02-10 MED ORDER — VITAMIN D (ERGOCALCIFEROL) 1.25 MG (50000 UNIT) PO CAPS: 50000.0000 [IU] | ORAL_CAPSULE | ORAL | 0 refills | Status: AC

## 2020-02-10 NOTE — Progress Notes (Signed)
Chief Complaint:   OBESITY Megan Potts is here to discuss her progress with her obesity treatment plan along with follow-up of her obesity related diagnoses. Megan Potts is on the Category 3 Plan and keeping a food journal and adhering to recommended goals of 450-600 calories and 40 grams of protein at supper daily and states she is following her eating plan approximately 50% of the time. Megan Potts states she is active while doing yard work.  Today's visit was #: 7 Starting weight: 205 lbs Starting date: 10/08/2019 Today's weight: 198 lbs Today's date: 02/10/2020 Total lbs lost to date: 7 Total lbs lost since last in-office visit: 0  Interim History: Megan Potts notes excessive hunger. She also notes occasional unintentional sabotage from her husband who has early dementia. She is dealing with work stress and stress from caring for her husband. .  Subjective:   1. Other specified hypothyroidism Megan Potts is stable on 25 mcg levothyroxine. She denies palpitations or heat/cold intolerance.  Lab Results  Component Value Date   TSH 1.620 10/08/2019   2. Pre-diabetes Megan Potts has a diagnosis of pre-diabetes based on her elevated Hgb A1c and was informed this puts her at greater risk of developing diabetes. She notes polyphagia and she is not on metformin. Ozempic was denied by her insurance. She continues to work on diet and exercise to decrease her risk of diabetes. She denies nausea or hypoglycemia.  Lab Results  Component Value Date   HGBA1C 5.8 (H) 10/08/2019   Lab Results  Component Value Date   INSULIN 9.6 10/08/2019   3. Other hyperlipidemia Megan Potts has hyperlipidemia and has been trying to improve her cholesterol levels with intensive lifestyle modification including a low saturated fat diet, exercise and weight loss. Last LDL was high at 186, triglycerides were elevated at 299, and HDL was normal. She is not on statin. She denies any chest pain, claudication or myalgias.  Lab Results    Component Value Date   ALT 10 10/08/2019   AST 13 10/08/2019   ALKPHOS 94 10/08/2019   BILITOT 0.2 10/08/2019   Lab Results  Component Value Date   CHOL 320 (H) 10/08/2019   HDL 76 10/08/2019   LDLCALC 186 (H) 10/08/2019   TRIG 299 (H) 10/08/2019   CHOLHDL 3.7 09/15/2014   The 10-year ASCVD risk score Denman George DC Jr., et al., 2013) is: 2.7%   Values used to calculate the score:     Age: 54 years     Sex: Female     Is Non-Hispanic African American: No     Diabetic: No     Tobacco smoker: No     Systolic Blood Pressure: 129 mmHg     Is BP treated: No     HDL Cholesterol: 76 mg/dL     Total Cholesterol: 320 mg/dL  4. Vitamin D deficiency Megan Potts's last Vit D level was low at 26.5. she is on prescription Vit D.  5. At risk for constipation Megan Potts is at increased risk for constipation due to the Low carbohydrate plan. Megan Potts denies hard, infrequent stools currently.   Assessment/Plan:   1. Other specified hypothyroidism . Megan Potts is with long-standing hypothyroidism, and she will continue 25 mcg levothyroxine therapy. She appears euthyroid. We will check labs today.   - Comprehensive metabolic panel - T3 - T4, free - TSH  2. Pre-diabetes Megan Potts will continue to work on weight loss, exercise, and decreasing simple carbohydrates to help decrease the risk of diabetes. We will check labs today.  -  Comprehensive metabolic panel - Hemoglobin A1c - Insulin, random  3. Other hyperlipidemia Cardiovascular risk and specific lipid/LDL goals reviewed. We discussed several lifestyle modifications today. Megan Potts will continue to work on diet, exercise and weight loss efforts. We will check labs today.  - Comprehensive metabolic panel - Lipid Panel With LDL/HDL Ratio  4. Vitamin D deficiency Low Vitamin D level contributes to fatigue and are associated with obesity, breast, and colon cancer. We will check labs today, and we will refill prescription Vitamin D for 1 month. Megan Potts will  follow-up for routine testing of Vitamin D, at least 2-3 times per year to avoid over-replacement.  - Vitamin D, Ergocalciferol, (DRISDOL) 1.25 MG (50000 UNIT) CAPS capsule; Take 1 capsule (50,000 Units total) by mouth every 7 (seven) days.  Dispense: 4 capsule; Refill: 0  - VITAMIN D 25 Hydroxy (Vit-D Deficiency, Fractures)  5. At risk for constipation Megan Potts was given approximately 15 minutes of counseling today regarding prevention of constipation. She was encouraged to increase water and fiber intake.   6. Class 1 obesity with serious comorbidity and body mass index (BMI) of 30.0 to 30.9 in adult, unspecified obesity type Megan Potts is currently in the action stage of change. As such, her goal is to continue with weight loss efforts. She has agreed to following a lower carbohydrate, vegetable and lean protein rich diet plan.   We discussed various medication options to help Megan Potts with her weight loss efforts and we both agreed to start Contrave 8-90 mg 2 pills BID with 1 month supply with no refills. She is to take 1 pill in the morning.  - Naltrexone-buPROPion HCl ER (CONTRAVE) 8-90 MG TB12; Take 2 tablets by mouth in the morning and at bedtime.  Dispense: 120 tablet; Refill: 0   Exercise goals: No exercise has been prescribed at this time.  Behavioral modification strategies: increasing lean protein intake, decreasing simple carbohydrates, increasing vegetables and increasing water intake.  Megan Potts has agreed to follow-up with our clinic in 3 weeks. She was informed of the importance of frequent follow-up visits to maximize her success with intensive lifestyle modifications for her multiple health conditions.   Megan Potts was informed we would discuss her lab results at her next visit unless there is a critical issue that needs to be addressed sooner. Megan Potts agreed to keep her next visit at the agreed upon time to discuss these results.  Objective:   Blood pressure 129/77, pulse 71,  temperature 97.7 F (36.5 C), temperature source Oral, height 5\' 9"  (1.753 m), weight 198 lb (89.8 kg), SpO2 98 %. Body mass index is 29.24 kg/m.  General: Cooperative, alert, well developed, in no acute distress. HEENT: Conjunctivae and lids unremarkable. Cardiovascular: Regular rhythm.  Lungs: Normal work of breathing. Neurologic: No focal deficits.   Lab Results  Component Value Date   CREATININE 0.75 10/08/2019   BUN 12 10/08/2019   NA 143 10/08/2019   K 4.4 10/08/2019   CL 102 10/08/2019   CO2 26 10/08/2019   Lab Results  Component Value Date   ALT 10 10/08/2019   AST 13 10/08/2019   ALKPHOS 94 10/08/2019   BILITOT 0.2 10/08/2019   Lab Results  Component Value Date   HGBA1C 5.8 (H) 10/08/2019   Lab Results  Component Value Date   INSULIN 9.6 10/08/2019   Lab Results  Component Value Date   TSH 1.620 10/08/2019   Lab Results  Component Value Date   CHOL 320 (H) 10/08/2019   HDL 76 10/08/2019  LDLCALC 186 (H) 10/08/2019   TRIG 299 (H) 10/08/2019   CHOLHDL 3.7 09/15/2014   Lab Results  Component Value Date   WBC 5.1 09/15/2014   HGB 12.3 09/15/2014   HCT 37.0 09/15/2014   MCV 88.3 09/15/2014   PLT 320 09/15/2014   No results found for: IRON, TIBC, FERRITIN  Attestation Statements:   Reviewed by clinician on day of visit: allergies, medications, problem list, medical history, surgical history, family history, social history, and previous encounter notes.   Wilhemena Durie, am acting as Location manager for [employer], FNP-C.  I have reviewed the above documentation for accuracy and completeness, and I agree with the above. -  Georgianne Fick, FNP

## 2020-02-11 ENCOUNTER — Encounter (INDEPENDENT_AMBULATORY_CARE_PROVIDER_SITE_OTHER): Payer: Self-pay | Admitting: Family Medicine

## 2020-02-11 LAB — COMPREHENSIVE METABOLIC PANEL
ALT: 12 IU/L (ref 0–32)
AST: 15 IU/L (ref 0–40)
Albumin/Globulin Ratio: 2 (ref 1.2–2.2)
Albumin: 4.3 g/dL (ref 3.8–4.9)
Alkaline Phosphatase: 94 IU/L (ref 48–121)
BUN/Creatinine Ratio: 23 (ref 9–23)
BUN: 15 mg/dL (ref 6–24)
Bilirubin Total: <0.2 mg/dL (ref 0.0–1.2)
CO2: 26 mmol/L (ref 20–29)
Calcium: 9.1 mg/dL (ref 8.7–10.2)
Chloride: 100 mmol/L (ref 96–106)
Creatinine, Ser: 0.65 mg/dL (ref 0.57–1.00)
GFR calc Af Amer: 115 mL/min/{1.73_m2} (ref >59)
GFR calc non Af Amer: 100 mL/min/{1.73_m2} (ref >59)
Globulin, Total: 2.1 g/dL (ref 1.5–4.5)
Glucose: 98 mg/dL (ref 65–99)
Potassium: 4.5 mmol/L (ref 3.5–5.2)
Sodium: 138 mmol/L (ref 134–144)
Total Protein: 6.4 g/dL (ref 6.0–8.5)

## 2020-02-11 LAB — T3: T3, Total: 110 ng/dL (ref 71–180)

## 2020-02-11 LAB — TSH: TSH: 2.89 u[IU]/mL (ref 0.450–4.500)

## 2020-02-11 LAB — LIPID PANEL WITH LDL/HDL RATIO
Cholesterol, Total: 324 mg/dL — ABNORMAL HIGH (ref 100–199)
HDL: 53 mg/dL (ref >39)
LDL Chol Calc (NIH): 157 mg/dL — ABNORMAL HIGH (ref 0–99)
LDL/HDL Ratio: 3 ratio (ref 0.0–3.2)
Triglycerides: 582 mg/dL (ref 0–149)
VLDL Cholesterol Cal: 114 mg/dL — ABNORMAL HIGH (ref 5–40)

## 2020-02-11 LAB — VITAMIN D 25 HYDROXY (VIT D DEFICIENCY, FRACTURES): Vit D, 25-Hydroxy: 24.5 ng/mL — ABNORMAL LOW (ref 30.0–100.0)

## 2020-02-11 LAB — T4, FREE: Free T4: 0.99 ng/dL (ref 0.82–1.77)

## 2020-02-11 LAB — INSULIN, RANDOM: INSULIN: 9.8 u[IU]/mL (ref 2.6–24.9)

## 2020-02-11 LAB — HEMOGLOBIN A1C
Est. average glucose Bld gHb Est-mCnc: 120 mg/dL
Hgb A1c MFr Bld: 5.8 % — ABNORMAL HIGH (ref 4.8–5.6)

## 2020-02-23 ENCOUNTER — Other Ambulatory Visit (INDEPENDENT_AMBULATORY_CARE_PROVIDER_SITE_OTHER): Payer: Self-pay | Admitting: Family Medicine

## 2020-02-23 DIAGNOSIS — E559 Vitamin D deficiency, unspecified: Secondary | ICD-10-CM

## 2020-02-26 ENCOUNTER — Encounter (INDEPENDENT_AMBULATORY_CARE_PROVIDER_SITE_OTHER): Payer: Self-pay

## 2020-03-02 ENCOUNTER — Ambulatory Visit (INDEPENDENT_AMBULATORY_CARE_PROVIDER_SITE_OTHER): Payer: BLUE CROSS/BLUE SHIELD | Admitting: Family Medicine

## 2020-04-05 DIAGNOSIS — Z20822 Contact with and (suspected) exposure to covid-19: Secondary | ICD-10-CM | POA: Diagnosis not present

## 2020-04-14 DIAGNOSIS — L579 Skin changes due to chronic exposure to nonionizing radiation, unspecified: Secondary | ICD-10-CM | POA: Diagnosis not present

## 2020-04-14 DIAGNOSIS — L814 Other melanin hyperpigmentation: Secondary | ICD-10-CM | POA: Diagnosis not present

## 2020-04-14 DIAGNOSIS — L821 Other seborrheic keratosis: Secondary | ICD-10-CM | POA: Diagnosis not present

## 2020-04-14 DIAGNOSIS — L720 Epidermal cyst: Secondary | ICD-10-CM | POA: Diagnosis not present

## 2020-04-26 DIAGNOSIS — H43812 Vitreous degeneration, left eye: Secondary | ICD-10-CM | POA: Diagnosis not present

## 2020-04-30 DIAGNOSIS — E039 Hypothyroidism, unspecified: Secondary | ICD-10-CM | POA: Diagnosis not present

## 2020-04-30 DIAGNOSIS — R7301 Impaired fasting glucose: Secondary | ICD-10-CM | POA: Diagnosis not present

## 2020-04-30 DIAGNOSIS — E7800 Pure hypercholesterolemia, unspecified: Secondary | ICD-10-CM | POA: Diagnosis not present

## 2020-04-30 DIAGNOSIS — E669 Obesity, unspecified: Secondary | ICD-10-CM | POA: Diagnosis not present

## 2020-04-30 DIAGNOSIS — H43812 Vitreous degeneration, left eye: Secondary | ICD-10-CM | POA: Diagnosis not present

## 2020-05-17 ENCOUNTER — Encounter: Payer: BLUE CROSS/BLUE SHIELD | Admitting: Nurse Practitioner

## 2020-05-24 DIAGNOSIS — H43812 Vitreous degeneration, left eye: Secondary | ICD-10-CM | POA: Diagnosis not present

## 2020-06-08 ENCOUNTER — Other Ambulatory Visit: Payer: Self-pay

## 2020-06-08 ENCOUNTER — Ambulatory Visit (INDEPENDENT_AMBULATORY_CARE_PROVIDER_SITE_OTHER): Payer: BLUE CROSS/BLUE SHIELD | Admitting: Nurse Practitioner

## 2020-06-08 ENCOUNTER — Encounter: Payer: Self-pay | Admitting: Nurse Practitioner

## 2020-06-08 VITALS — BP 120/70 | Ht 68.75 in | Wt 206.0 lb

## 2020-06-08 DIAGNOSIS — Z1151 Encounter for screening for human papillomavirus (HPV): Secondary | ICD-10-CM | POA: Diagnosis not present

## 2020-06-08 DIAGNOSIS — Z79890 Hormone replacement therapy (postmenopausal): Secondary | ICD-10-CM | POA: Diagnosis not present

## 2020-06-08 DIAGNOSIS — Z90710 Acquired absence of both cervix and uterus: Secondary | ICD-10-CM | POA: Diagnosis not present

## 2020-06-08 DIAGNOSIS — Z01419 Encounter for gynecological examination (general) (routine) without abnormal findings: Secondary | ICD-10-CM

## 2020-06-08 MED ORDER — ESTRADIOL 0.5 MG PO TABS: 0.5000 mg | ORAL_TABLET | Freq: Every day | ORAL | 4 refills | Status: AC

## 2020-06-08 NOTE — Progress Notes (Signed)
   JADIS PITTER [birth year] [id number]   History:  54 y.o. G2P2 presents for annual exam. 2004 TVH for fibroids on Estrace 0.5 mg daily for hot flashes with good relief. She has been on HRT for 5/6 years.  Normal Pap history.  Hypothyroidism managed by PCP, on low dose of Synthroid.  Has a stable right breast mass now back to annual screenings.  Gynecologic History No LMP recorded. Patient has had a hysterectomy.   Last Pap: 08/01/2010. Results were: normal Last mammogram: 12/2018. Results were: stable right calcifications Colonoscopy: at age 27. Results were: normal   Past medical history, past surgical history, family history and social history were all reviewed and documented in the EPIC chart.  ROS:  A ROS was performed and pertinent positives and negatives are included.  Exam:  Vitals:   06/08/20 0830  BP: 120/70  Weight: 206 lb (93.4 kg)  Height: 5' 8.75" (1.746 m)   Body mass index is 30.64 kg/m.  General appearance:  Normal Thyroid:  Symmetrical, normal in size, without palpable masses or nodularity. Respiratory  Auscultation:  Clear without wheezing or rhonchi Cardiovascular  Auscultation:  Regular rate, without rubs, murmurs or gallops  Edema/varicosities:  Not grossly evident Abdominal  Soft,nontender, without masses, guarding or rebound.  Liver/spleen:  No organomegaly noted  Hernia:  None appreciated  Skin  Inspection:  Grossly normal   Breasts: Examined lying and sitting.   Right: Without masses, retractions, discharge or axillary adenopathy.   Left: Without masses, retractions, discharge or axillary adenopathy. Gentitourinary   Inguinal/mons:  Normal without inguinal adenopathy  External genitalia:  Normal  BUS/Urethra/Skene's glands:  Normal  Vagina:  Atrophic  Cervix:  Normal  Uterus:  Normal in size, shape and contour.  Midline and mobile  Adnexa/parametria:     Rt: Without masses or tenderness.   Lt: Without masses or tenderness.  Anus and  perineum: Normal  Digital rectal exam: Normal sphincter tone without palpated masses or tenderness  Assessment/Plan:  54 y.o. G2P2 for annual exam.   Well female exam with routine gynecological exam - Education provided on SBEs, importance of preventative screenings, current guidelines, high calcium diet, regular exercise, and multivitamin daily. Labs with PCP.   Hormone replacement therapy (HRT) - Plan: estradiol (ESTRACE) 0.5 MG tablet daily for management of hot flashes. Has been on HRT for 5-6 years.  She is aware of the slight risk for blood clots, heart attack, stroke, and breast cancer.  She would like to continue.  Recommend cutting dose in half to see how she does and if possible stopping altogether.  Discussed current guidelines recommend maximum of 7 year use.  She is agreeable to plan.  Refill x1 year provided.  History of total vaginal hysterectomy (TVH) - 2004 for fibroids. On HRT.   Screening for cervical cancer -normal Pap history.  Pap today at 10-year interval.  Screening for breast cancer -stable right breast calcifications.  She is due for screening mammogram and promises to schedule this soon.  Screening for colon cancer -screening colonoscopy 5 years ago was normal. Follow-up at their recommended interval.  Follow-up in 1 year for annual.     Olivia Mackie [facility], 8:35 AM 06/08/2020

## 2020-06-08 NOTE — Addendum Note (Signed)
Addended by: Berna Spare A on: 06/08/2020 09:47 AM   Modules accepted: Orders

## 2020-06-08 NOTE — Patient Instructions (Signed)

## 2020-06-09 LAB — PAP, TP IMAGING W/ HPV RNA, RFLX HPV TYPE 16,18/45: HPV DNA High Risk: NOT DETECTED

## 2020-07-06 ENCOUNTER — Other Ambulatory Visit: Payer: Self-pay | Admitting: General Practice

## 2020-07-06 DIAGNOSIS — Z1231 Encounter for screening mammogram for malignant neoplasm of breast: Secondary | ICD-10-CM

## 2020-07-14 ENCOUNTER — Ambulatory Visit
Admission: RE | Admit: 2020-07-14 | Discharge: 2020-07-14 | Disposition: A | Discharge status: Home / Self Care | Payer: 59 | Source: Ambulatory Visit

## 2020-07-14 ENCOUNTER — Other Ambulatory Visit: Payer: Self-pay

## 2020-07-14 DIAGNOSIS — Z1231 Encounter for screening mammogram for malignant neoplasm of breast: Secondary | ICD-10-CM

## 2021-06-09 ENCOUNTER — Ambulatory Visit: Payer: BLUE CROSS/BLUE SHIELD | Admitting: Nurse Practitioner

## 2021-06-09 ENCOUNTER — Other Ambulatory Visit: Payer: Self-pay

## 2021-06-09 NOTE — Progress Notes (Deleted)
   Megan Potts 1966/04/20 [id number]   History:  55 y.o. G2P2 presents for annual exam. Postmenopausal - on ERT. S/P 2004 TVH for fibroids. Normal Pap history.  Hypothyroidism managed by PCP.   Gynecologic History No LMP recorded. Patient has had a hysterectomy.   Sexually active: ***  Health Maintenance Last Pap: 06/08/2020. Results were: Normal Last mammogram: 07/14/2020. Results were: Normal Last colonoscopy: age 31. Results were: Normal Last Dexa: Not indicated  Past medical history, past surgical history, family history and social history were all reviewed and documented in the EPIC chart.  ROS:  A ROS was performed and pertinent positives and negatives are included.  Exam:  There were no vitals filed for this visit.  There is no height or weight on file to calculate BMI.  General appearance:  Normal Thyroid:  Symmetrical, normal in size, without palpable masses or nodularity. Respiratory  Auscultation:  Clear without wheezing or rhonchi Cardiovascular  Auscultation:  Regular rate, without rubs, murmurs or gallops  Edema/varicosities:  Not grossly evident Abdominal  Soft,nontender, without masses, guarding or rebound.  Liver/spleen:  No organomegaly noted  Hernia:  None appreciated  Skin  Inspection:  Grossly normal   Breasts: Examined lying and sitting.   Right: Without masses, retractions, discharge or axillary adenopathy.   Left: Without masses, retractions, discharge or axillary adenopathy. Gentitourinary   Inguinal/mons:  Normal without inguinal adenopathy  External genitalia:  Normal  BUS/Urethra/Skene's glands:  Normal  Vagina:  Atrophic  Cervix:  Absent  Uterus:  Absent  Adnexa/parametria:     Rt: Without masses or tenderness.   Lt: Without masses or tenderness.  Anus and perineum: Normal  Digital rectal exam: Normal sphincter tone without palpated masses or tenderness  Patient informed chaperone available to be present for breast and pelvic  exam. Patient has requested no chaperone to be present. Patient has been advised what will be completed during breast and pelvic exam.   Assessment/Plan:  55 y.o. G2P2 for annual exam.   Well female exam with routine gynecological exam - Education provided on SBEs, importance of preventative screenings, current guidelines, high calcium diet, regular exercise, and multivitamin daily. Labs with PCP.   Hormone replacement therapy (HRT) - Plan: estradiol (ESTRACE) 0.5 MG tablet daily for management of hot flashes. She is aware of the slight risk for blood clots, heart attack, stroke, and breast cancer.  She would like to continue.  Recommend cutting dose in half to see how she does and if possible stopping altogether.  Discussed current guidelines recommend maximum of 7 year use.  She is agreeable to plan.  Refill x1 year provided.  Screening for cervical cancer -normal Pap history.  No longer screening per guidelines.  Screening for breast cancer -stable right breast calcifications.  She is due for screening mammogram and promises to schedule this soon.  Screening for colon cancer -screening colonoscopy 5 years ago was normal. Follow-up at their recommended interval.  Follow-up in 1 year for annual.     Olivia Mackie [facility], 7:58 AM 06/09/2021

## 2021-08-08 IMAGING — MG DIGITAL SCREENING BILAT W/ TOMO W/ CAD
8 series · 8 of 24 positions shown · non-contrast
Comparison: Previous exam(s).

CLINICAL DATA: Screening.

EXAM:
DIGITAL SCREENING BILATERAL MAMMOGRAM WITH TOMO AND CAD

[R CC synth-2D]
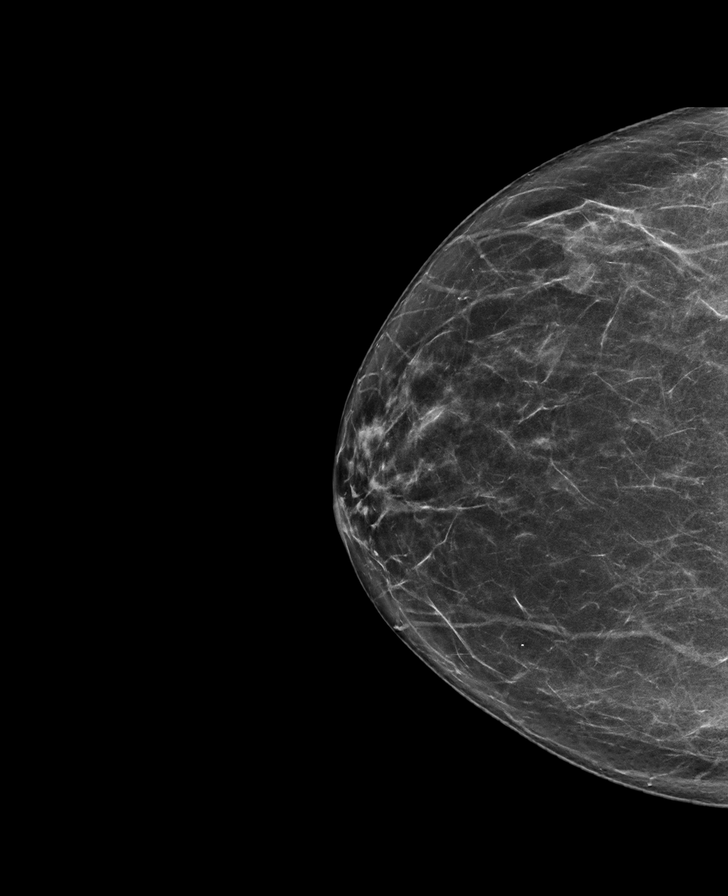

[R MLO synth-2D]
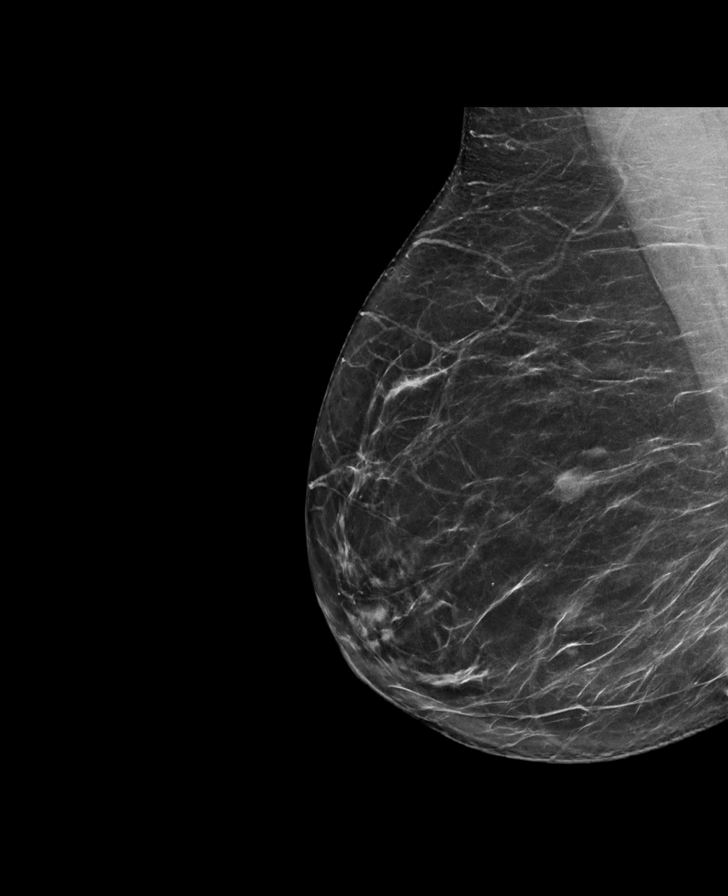

[L MLO synth-2D]
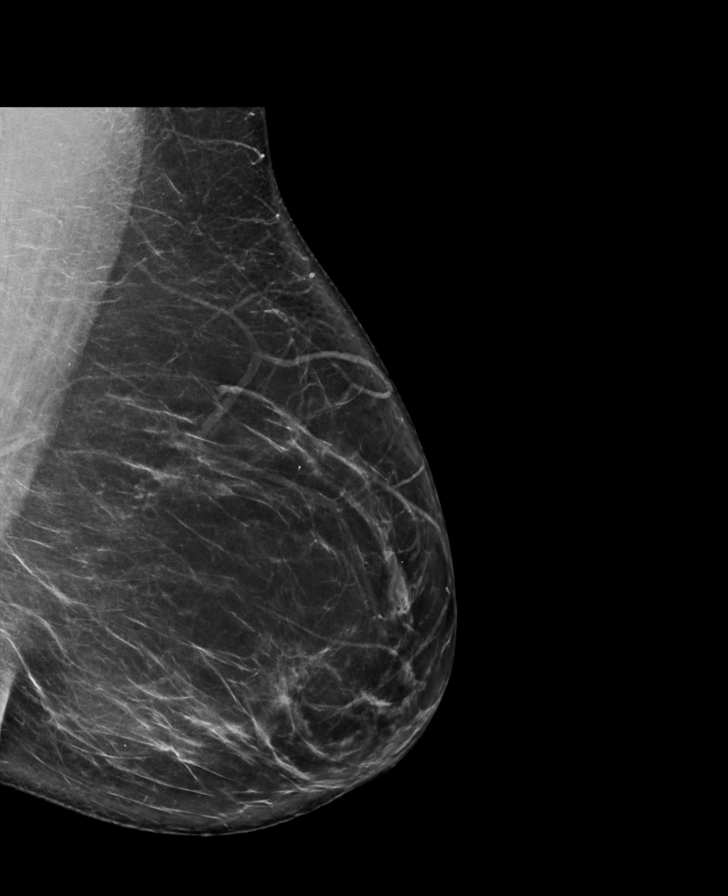

[L CC synth-2D]
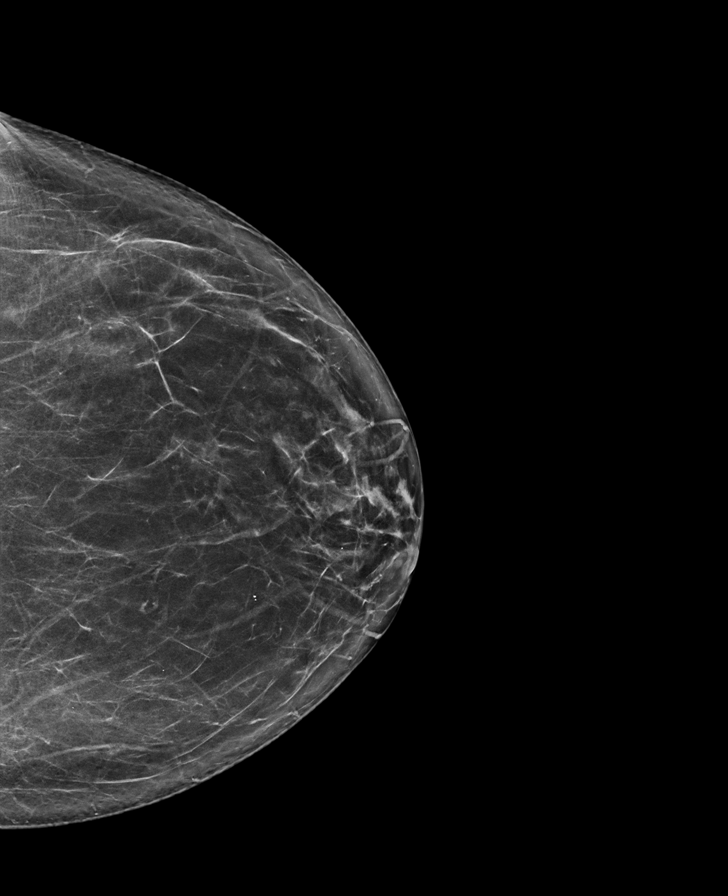

[R CC tomo · tomo slice 36/71.0]
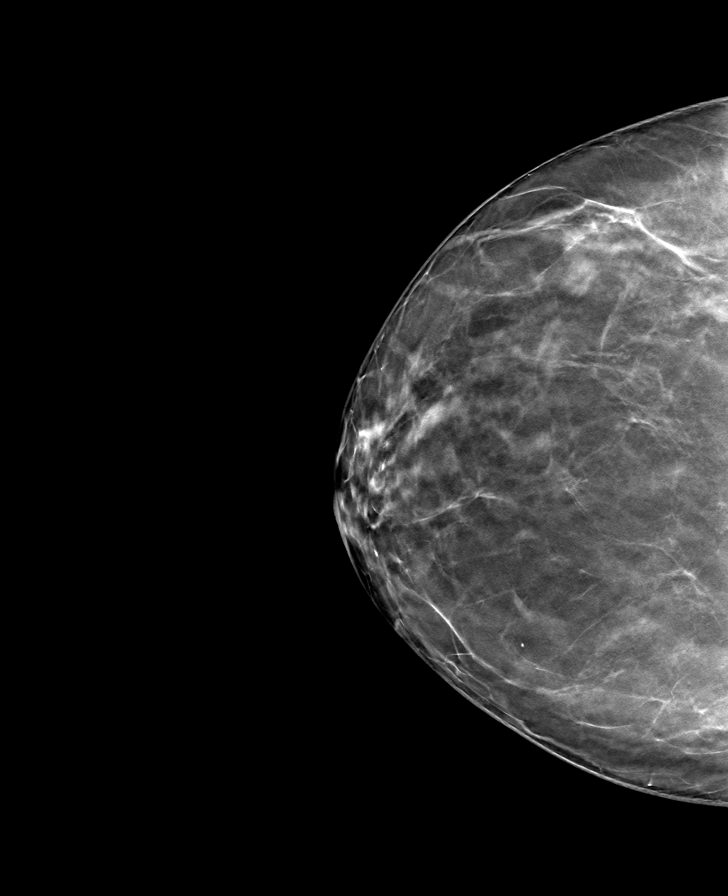

[L MLO tomo · tomo slice 43/85.0]
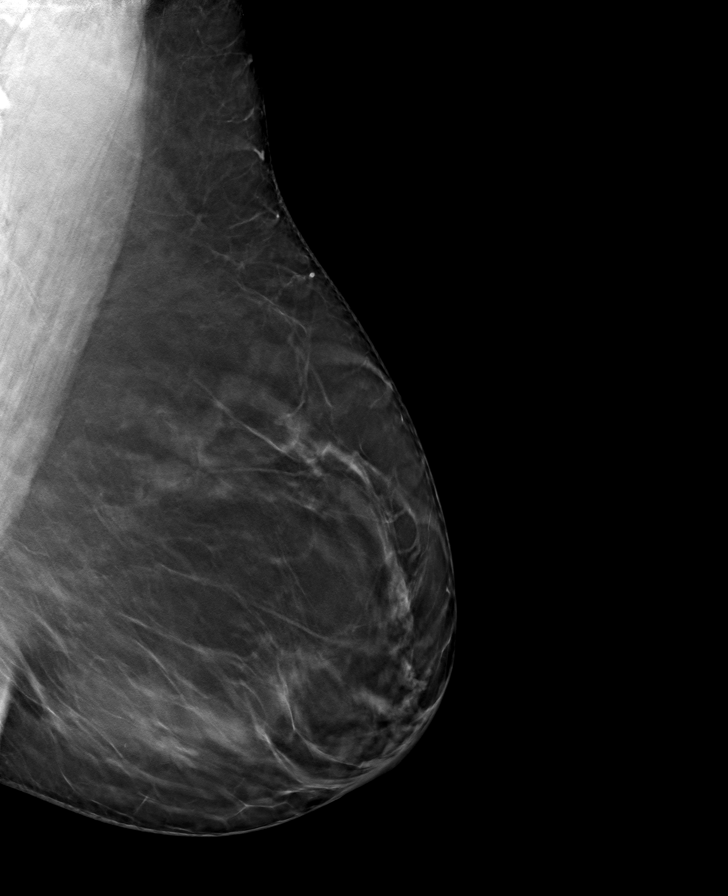

[R MLO tomo · tomo slice 41/81.0]
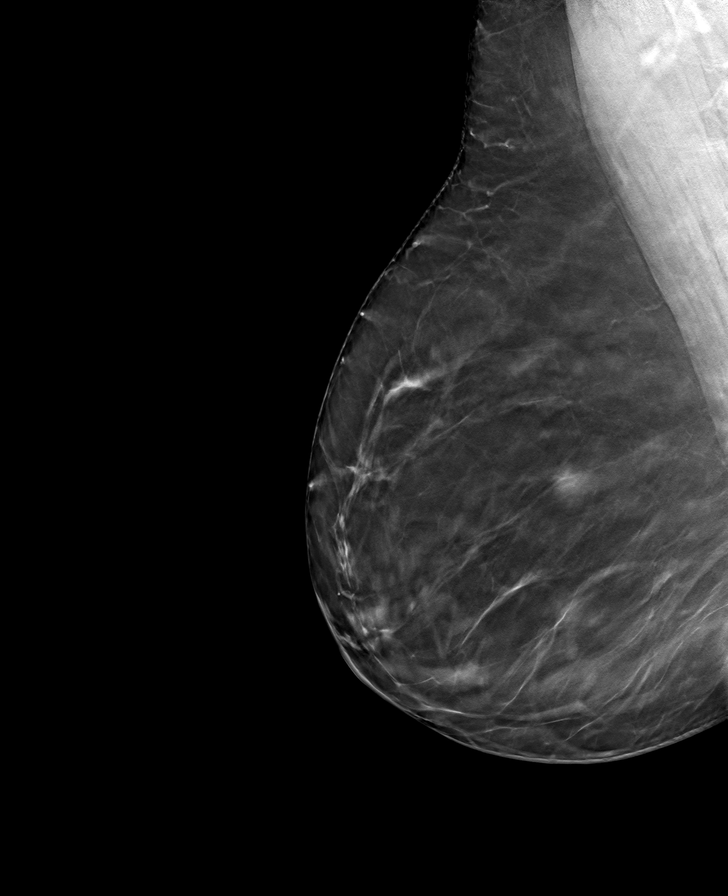

[L CC tomo · tomo slice 39/78.0]
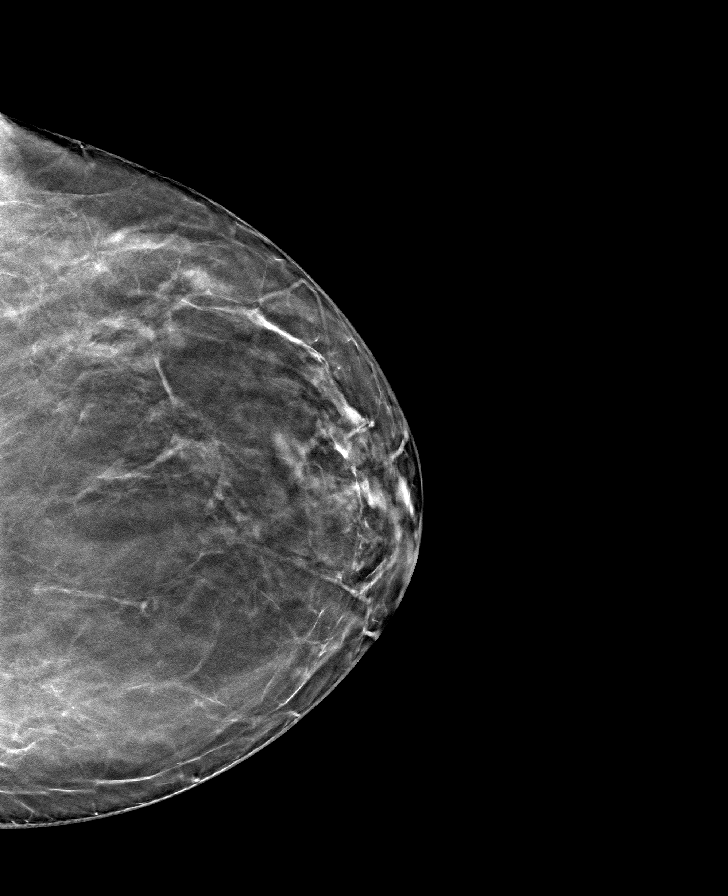

[8 of 24 positions shown; findings below may reference images not displayed]

ACR Breast Density Category b: There are scattered areas of
fibroglandular density.
FINDINGS: There are no findings suspicious for malignancy. Images were
processed with CAD.
IMPRESSION: No mammographic evidence of malignancy. A result letter of this
screening mammogram will be mailed directly to the patient.

RECOMMENDATION:
Screening mammogram in one year. (Code:[id number])

BI-RADS CATEGORY  1: Negative.

## 2021-08-25 ENCOUNTER — Other Ambulatory Visit: Payer: Self-pay | Admitting: Nurse Practitioner

## 2021-08-25 DIAGNOSIS — Z79890 Hormone replacement therapy (postmenopausal): Secondary | ICD-10-CM

## 2021-09-01 ENCOUNTER — Other Ambulatory Visit: Payer: Self-pay | Admitting: General Practice

## 2021-09-01 DIAGNOSIS — Z1231 Encounter for screening mammogram for malignant neoplasm of breast: Secondary | ICD-10-CM

## 2021-09-22 ENCOUNTER — Ambulatory Visit
Admission: RE | Admit: 2021-09-22 | Discharge: 2021-09-22 | Disposition: A | Discharge status: Home / Self Care | Payer: 59 | Source: Ambulatory Visit

## 2021-09-22 DIAGNOSIS — Z1231 Encounter for screening mammogram for malignant neoplasm of breast: Secondary | ICD-10-CM

## 2022-04-05 ENCOUNTER — Encounter (INDEPENDENT_AMBULATORY_CARE_PROVIDER_SITE_OTHER): Payer: Self-pay

## 2022-10-16 ENCOUNTER — Other Ambulatory Visit: Payer: Self-pay | Admitting: General Practice

## 2022-10-16 DIAGNOSIS — Z1231 Encounter for screening mammogram for malignant neoplasm of breast: Secondary | ICD-10-CM

## 2022-12-04 ENCOUNTER — Ambulatory Visit: Payer: 59

## 2022-12-29 ENCOUNTER — Ambulatory Visit
Admission: RE | Admit: 2022-12-29 | Discharge: 2022-12-29 | Disposition: A | Discharge status: Home / Self Care | Payer: 59 | Source: Ambulatory Visit | Attending: General Practice | Admitting: General Practice

## 2022-12-29 DIAGNOSIS — Z1231 Encounter for screening mammogram for malignant neoplasm of breast: Secondary | ICD-10-CM

## 2023-11-27 ENCOUNTER — Encounter: Payer: Self-pay | Admitting: General Practice

## 2023-12-03 ENCOUNTER — Other Ambulatory Visit: Payer: Self-pay | Admitting: General Practice

## 2023-12-03 DIAGNOSIS — Z0000 Encounter for general adult medical examination without abnormal findings: Secondary | ICD-10-CM

## 2024-01-02 ENCOUNTER — Ambulatory Visit
Admission: RE | Admit: 2024-01-02 | Discharge: 2024-01-02 | Disposition: A | Discharge status: Home / Self Care | Payer: Self-pay | Source: Ambulatory Visit | Attending: General Practice | Admitting: General Practice

## 2024-01-02 DIAGNOSIS — Z0000 Encounter for general adult medical examination without abnormal findings: Secondary | ICD-10-CM
